# Patient Record
Sex: Male | Born: 1940 | Race: White | Hispanic: No | Marital: Married | Smoking: Former smoker
Health system: Southern US, Community
[De-identification: ages and names within clinical notes are randomized; demographics above are authoritative.]

## PROBLEM LIST (undated history)

## (undated) DIAGNOSIS — L409 Psoriasis, unspecified: Secondary | ICD-10-CM

## (undated) DIAGNOSIS — M1990 Unspecified osteoarthritis, unspecified site: Secondary | ICD-10-CM

## (undated) DIAGNOSIS — N400 Enlarged prostate without lower urinary tract symptoms: Secondary | ICD-10-CM

## (undated) DIAGNOSIS — K219 Gastro-esophageal reflux disease without esophagitis: Secondary | ICD-10-CM

## (undated) DIAGNOSIS — E039 Hypothyroidism, unspecified: Secondary | ICD-10-CM

## (undated) DIAGNOSIS — J189 Pneumonia, unspecified organism: Secondary | ICD-10-CM

## (undated) HISTORY — PX: PROSTATE SURGERY: SHX751

---

## 2001-08-09 ENCOUNTER — Emergency Department (HOSPITAL_COMMUNITY): Admission: EM | Admit: 2001-08-09 | Discharge: 2001-08-09 | Payer: Self-pay | Admitting: Emergency Medicine

## 2001-08-18 ENCOUNTER — Emergency Department (HOSPITAL_COMMUNITY): Admission: EM | Admit: 2001-08-18 | Discharge: 2001-08-18 | Payer: Self-pay | Admitting: Emergency Medicine

## 2002-03-08 ENCOUNTER — Ambulatory Visit (HOSPITAL_COMMUNITY): Admission: RE | Admit: 2002-03-08 | Discharge: 2002-03-08 | Payer: Self-pay | Admitting: Internal Medicine

## 2002-03-17 ENCOUNTER — Encounter: Payer: Self-pay | Admitting: Family Medicine

## 2002-03-17 ENCOUNTER — Ambulatory Visit (HOSPITAL_COMMUNITY): Admission: RE | Admit: 2002-03-17 | Discharge: 2002-03-17 | Payer: Self-pay | Admitting: Family Medicine

## 2003-07-20 ENCOUNTER — Ambulatory Visit (HOSPITAL_COMMUNITY): Admission: RE | Admit: 2003-07-20 | Discharge: 2003-07-20 | Payer: Self-pay | Admitting: Family Medicine

## 2006-11-05 ENCOUNTER — Ambulatory Visit (HOSPITAL_COMMUNITY): Admission: RE | Admit: 2006-11-05 | Discharge: 2006-11-05 | Payer: Self-pay | Admitting: Family Medicine

## 2008-05-23 ENCOUNTER — Encounter (INDEPENDENT_AMBULATORY_CARE_PROVIDER_SITE_OTHER): Payer: Self-pay | Admitting: *Deleted

## 2008-07-26 DIAGNOSIS — K921 Melena: Secondary | ICD-10-CM

## 2008-07-26 DIAGNOSIS — D126 Benign neoplasm of colon, unspecified: Secondary | ICD-10-CM | POA: Insufficient documentation

## 2008-07-26 DIAGNOSIS — K644 Residual hemorrhoidal skin tags: Secondary | ICD-10-CM | POA: Insufficient documentation

## 2010-05-21 NOTE — Procedures (Signed)
Tyler Gordon, Tyler Gordon                ACCOUNT NO.:  [id number]   MEDICAL RECORD NO.:  [id number]          PATIENT TYPE:  OUT   LOCATION:  [facility]                          FACILITY:  [facility]   PHYSICIAN:  Donna Bernard, M.D.DATE OF BIRTH:  09-24-1940   DATE OF PROCEDURE:  11/05/2006  DATE OF DISCHARGE:  11/05/2006                                  STRESS TEST   INDICATION FOR TEST:  This patient is a 70 year old white male with  several risk factors and atypical chest pain.   Stress test was performed with a standard Bruce protocol.  Resting EKG  revealed normal sinus rhythm with no significant ST-T changes.  The  patient developed pretty quick tachycardia with exercise.  His target  heart rate was 130.  This was a sub-max predicted heart rate.  The  patient at the end of stage I noticed a little shortness of breath but  no chest pain.  Two minutes into stage II the patient had reached a  heart rate of 140.  This surpassed the sub-max predicted heart rate.  At  this point at 0.08 seconds out past the J-point there was minimal ST  depression noted.  The ST slope in these areas was sharply.  After the  test was stopped, the rhythm returned to normal sinus rhythm fairly  rapidly with no abnormalities noted in the ST-segment.   IMPRESSION:  Negative adequate stress test.   PLAN:  Regular exercise program encouraged.      Donna Bernard, M.D.  Electronically Signed     WSL/MEDQ  D:  11/16/2006  T:  11/16/2006  Job:  [id number]

## 2010-05-21 NOTE — Procedures (Signed)
NAME:  Tyler Gordon, Tyler Gordon                ACCOUNT NO.:  [id number]   MEDICAL RECORD NO.:  [id number]          PATIENT TYPE:  OUT   LOCATION:  [facility]                          FACILITY:  [facility]   PHYSICIAN:  Donna Bernard, M.D.DATE OF BIRTH:  1940/12/27   DATE OF PROCEDURE:  11/04/2006  DATE OF DISCHARGE:                                  STRESS TEST   PROCEDURE:  Stress test.   INDICATION FOR TEST:  This patient is a 70 year old gentleman with  multiple risk factors who would like to start an exercise program. At  times he gets transient chest pain very atypical in nature but was  concern with his risk factors about pressing on with significant  exercise without a test.  The test was performed at Standard Bruce  protocol, resting EKG revealed normal sinus rhythm with no significant  ST-T changes. The patient tolerated the first stage while. He developed  progressive dyspnea and tachycardia throughout the second stage.  The  patient reaches sub max predicted heart rate of 138 at only 1-1/2  minutes in the second stage. At 5 minutes into the test, the test was  stopped on the basis of having surpassed max predicted heart rate.  The  patient reached a maximum heart rate in the low 140s. At this point at  0.08 seconds past the J-point, there were no clinically significant ST-  segment depressions evident. There was some mild depression a millimeter  or less at 0.08 seconds past J-point but all with a sharply ascending  slope.   IMPRESSION:  Negative adequate stress test.   PLAN:  The patient encouraged to press on with exercise program.      W. Simone Curia, M.D.  Electronically Signed     WSL/MEDQ  D:  11/05/2006  T:  11/06/2006  Job:  [id number]

## 2010-05-24 NOTE — Op Note (Signed)
   NAME:  Tyler Gordon, Tyler Gordon                          ACCOUNT NO.:  [id number]   MEDICAL RECORD NO.:  [id number]                   PATIENT TYPE:  AMB   LOCATION:  DAY                                  FACILITY:  [facility]   PHYSICIAN:  Lionel December, M.D.                 DATE OF BIRTH:  [birth year]   DATE OF PROCEDURE:  03/08/2002  DATE OF DISCHARGE:                                 OPERATIVE REPORT   PROCEDURE:  Total colonoscopy.   ENDOSCOPIST:  Lionel December, M.D.   INDICATIONS:  This patient is a 70 year old Caucasian male who is here for  screening colonoscopy.  Family history is negative for colorectal cancer.  The procedures and risks were reviewed with the patient and informed consent  was obtained.   PREOPERATIVE MEDICATIONS:  Demerol 40 mg IV and Versed 6 mg IV in divided  dose.   INSTRUMENT:  Olympus video system.   FINDINGS:  Procedure performed in endoscopy suite.  The patient's vital  signs and O2 saturation were monitored during the procedure and remained  stable.  The patient was placed in the left lateral recumbent position and  rectal examination was performed.  This was within normal limits.   The scope was placed in the rectum and advanced under vision into the  sigmoid colon and beyond.  Preparation was excellent.  Scope was passed into  the cecum which was identified by appendiceal orifice and ileocecal valve.  A short segment of TI was also examined and was normal.  As the scope was  withdrawn the colonic mucosa was once again carefully examined.  Scattered  diverticula were noted at the descending and sigmoid colon.  These were  small-to-medium in size.  Rectal mucosa was normal.   The scope was retroflexed to examine the anorectal junction which was  unremarkable.  The endoscope was straightened and withdrawn.  The patient  tolerated the procedure well.   FINAL DIAGNOSIS:  Left colonic diverticulosis otherwise normal colonoscopy.   RECOMMENDATIONS:  1. High  fiber diet.  2.     Citrucel or equivalent 1 tablespoonful daily.  3. Yearly Hemoccults when he has his physical exam.  He should consider next     screening exam 10 years from now.                                               Lionel December, M.D.    NR/MEDQ  D:  03/08/2002  T:  03/08/2002  Job:  [id number]   cc:   Donna Bernard, M.D.  [address]  [place]  [place] [address]  Fax: [phone]

## 2010-07-26 ENCOUNTER — Ambulatory Visit (INDEPENDENT_AMBULATORY_CARE_PROVIDER_SITE_OTHER): Payer: Managed Care, Other (non HMO) | Admitting: Urology

## 2010-07-26 DIAGNOSIS — N401 Enlarged prostate with lower urinary tract symptoms: Secondary | ICD-10-CM

## 2010-07-26 DIAGNOSIS — N529 Male erectile dysfunction, unspecified: Secondary | ICD-10-CM

## 2010-07-26 DIAGNOSIS — R972 Elevated prostate specific antigen [PSA]: Secondary | ICD-10-CM

## 2010-07-26 DIAGNOSIS — R339 Retention of urine, unspecified: Secondary | ICD-10-CM

## 2010-08-30 ENCOUNTER — Ambulatory Visit (INDEPENDENT_AMBULATORY_CARE_PROVIDER_SITE_OTHER): Payer: Managed Care, Other (non HMO) | Admitting: Urology

## 2010-08-30 DIAGNOSIS — R339 Retention of urine, unspecified: Secondary | ICD-10-CM

## 2010-08-30 DIAGNOSIS — N529 Male erectile dysfunction, unspecified: Secondary | ICD-10-CM

## 2010-08-30 DIAGNOSIS — N401 Enlarged prostate with lower urinary tract symptoms: Secondary | ICD-10-CM

## 2010-11-15 ENCOUNTER — Ambulatory Visit (INDEPENDENT_AMBULATORY_CARE_PROVIDER_SITE_OTHER): Payer: Medicare Other | Admitting: Urology

## 2010-11-15 ENCOUNTER — Other Ambulatory Visit: Payer: Self-pay | Admitting: Urology

## 2010-11-15 DIAGNOSIS — N401 Enlarged prostate with lower urinary tract symptoms: Secondary | ICD-10-CM

## 2010-11-15 DIAGNOSIS — R338 Other retention of urine: Secondary | ICD-10-CM

## 2010-11-19 ENCOUNTER — Encounter (HOSPITAL_COMMUNITY): Payer: Self-pay

## 2010-11-22 ENCOUNTER — Encounter (HOSPITAL_COMMUNITY)
Admission: RE | Admit: 2010-11-22 | Discharge: 2010-11-22 | Payer: Medicare Other | Source: Ambulatory Visit | Attending: Urology | Admitting: Urology

## 2010-11-22 ENCOUNTER — Encounter (HOSPITAL_COMMUNITY): Payer: Self-pay

## 2010-11-22 DIAGNOSIS — L409 Psoriasis, unspecified: Secondary | ICD-10-CM

## 2010-11-22 DIAGNOSIS — M1990 Unspecified osteoarthritis, unspecified site: Secondary | ICD-10-CM

## 2010-11-22 DIAGNOSIS — K219 Gastro-esophageal reflux disease without esophagitis: Secondary | ICD-10-CM

## 2010-11-22 DIAGNOSIS — J189 Pneumonia, unspecified organism: Secondary | ICD-10-CM

## 2010-11-22 DIAGNOSIS — N400 Enlarged prostate without lower urinary tract symptoms: Secondary | ICD-10-CM

## 2010-11-22 HISTORY — DX: Pneumonia, unspecified organism: J18.9

## 2010-11-22 HISTORY — DX: Unspecified osteoarthritis, unspecified site: M19.90

## 2010-11-22 HISTORY — DX: Benign prostatic hyperplasia without lower urinary tract symptoms: N40.0

## 2010-11-22 HISTORY — DX: Psoriasis, unspecified: L40.9

## 2010-11-22 HISTORY — DX: Gastro-esophageal reflux disease without esophagitis: K21.9

## 2010-11-22 HISTORY — PX: VASECTOMY: SHX75

## 2010-11-22 LAB — CBC
HCT: 46.3 % (ref 39.0–52.0)
MCH: 30.9 pg (ref 26.0–34.0)
MCHC: 34.3 g/dL (ref 30.0–36.0)
RDW: 12.7 % (ref 11.5–15.5)

## 2010-11-22 LAB — SURGICAL PCR SCREEN
MRSA, PCR: NEGATIVE
Staphylococcus aureus: POSITIVE — AB

## 2010-11-22 NOTE — Patient Instructions (Addendum)
20 [name]  11/22/2010   Your procedure is scheduled on: 11-26-10  Report to [facility] at 11:00 AM.  Call this number if you have problems the morning of surgery: [phone]   Remember:   Do not eat food:After Midnight.  Do not drink clear liquids: After Midnight.may have Clear Liquids until 6:00AM 11-26-10.  Take these medicines the morning of surgery with A SIP OF WATER:Allopurinol   Do not wear jewelry, make-up or nail polish.  Do not wear lotions, powders, or perfumes. You may wear deodorant.  Do not shave 48 hours prior to surgery.  Do not bring valuables to the hospital.  Contacts, dentures or bridgework may not be worn into surgery.  Leave suitcase in the car. After surgery it may be brought to your room.  For patients admitted to the hospital, checkout time is 11:00 AM the day of discharge.   Patients discharged the day of surgery will not be allowed to drive home.  Name and phone number of your driver: mohmed, farver  [phone]  Special Instructions: CHG Shower Use Special Wash: 1/2 bottle night before surgery and 1/2 bottle morning of surgery.   Please read over the following fact sheets that you were given: Blood Transfusion Information and MRSA Information

## 2010-11-25 MED ORDER — CEFOXITIN SODIUM 1 G IV SOLR
1.0000 g | INTRAVENOUS | Status: DC
  Filled 2010-11-25: qty 1

## 2010-11-25 MED ORDER — DEXTROSE 5 % IV SOLN
2.0000 g | Freq: Once | INTRAVENOUS | Status: AC
  Administered 2010-11-26: 2 g via INTRAVENOUS
  Filled 2010-11-25: qty 2

## 2010-11-25 NOTE — H&P (Signed)
Active Problems 1. Acute Urinary Retention 788.20 2. 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 Gross Hematuria 599.71 4. 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 PSA,Elevated 790.93  History of Present Illness  Tyler Gordon returns today for a TURP.  He has BPH with BOO and retention with a PVR of 1900cc.  he was placed on finasteride on 10/29 in addition to his terazosin.  He is not tolerating the foley well and failed a voiding trial.   Past Medical History 1. History of  Arthritis V13.4 2. History of  Depression 311 3. History of  Esophageal Reflux 530.81 4. History of  Gout 274.9 5. History of  Psoriasis 696.1  Surgical History 1. History of  Surgery Of Male Genitalia Vasectomy V25.2  Current Meds 1. Allopurinol 300 MG Oral Tablet; Therapy: (Recorded:20Jul2012) to 2. Ciprofloxacin HCl 500 MG Oral Tablet; Therapy: 26Oct2012 to 3. Finasteride 5 MG Oral Tablet; TAKE 1 TABLET DAILY AS DIRECTED; Therapy: 24Aug2012 to  (Evaluate:24Oct2013)  Requested for: 29Oct2012; Last Rx:29Oct2012 4. Terazosin HCl 10 MG Oral Capsule; Therapy: (Recorded:24Aug2012) to  Allergies 1. No Known Drug Allergies  Family History 1. Family history of  Family Health Status - Mother's Age 61. Family history of  Family Health Status Children ___ Living Daughters 3. Family history of  Family Health Status Children ___ Living Sons 4. Family history of  Family Health Status Number Of Children 5. Family history of  Father Deceased At Age ____ 6. Maternal history of  Hypertension V17.49 7. Family history of  Hypertension V17.49 8. Fraternal history of  Prostate Cancer V16.42  Social History 1. Alcohol Use 2 per day 2. Caffeine Use 3. Former Smoker V15.82 1ppd for 40 years. 4. Marital History - Currently Married 5. 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  Review of Systems  Cardiovascular: no chest pain.  Respiratory: no shortness of breath.      Physical Exam Constitutional: Well nourished and well developed . No acute distress.  Pulmonary: No respiratory distress and normal respiratory rhythm and effort.  Cardiovascular: Heart rate and rhythm are normal . No peripheral edema.    Procedure  His bladder was filled with 250cc and the foley was removed.  He was unable to void and cystoscopy was performed to assess for therapeutic options.   Procedure: Cystoscopy  Indication: Lower Urinary Tract Symptoms.  Informed Consent: Risks, benefits, and potential adverse events were discussed and informed consent was obtained from the patient.  Prep: The patient was prepped with betadine.  Procedure Note:  Urethral meatus:. No abnormalities.  Anterior urethra: No abnormalities.  Prostatic urethra:. Estimated length was 4 cm. There was visual obstruction of the prostatic urethra. The lateral prostatic lobes were enlarged. No intravesical median lobe was visualized.  Bladder: Visulization was clear. The ureteral orifices were in the normal anatomic position bilaterally and had clear efflux of urine. A systematic survey of the bladder demonstrated no bladder tumors or stones. The mucosa was smooth without abnormalities. Examination of the bladder demonstrated moderate trabeculation erythematous mucosa and edema (on the posterior wall from the foley). The patient tolerated the procedure well.  Complications: None.    Assessment 1. 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 Acute Urinary Retention 788.20   He has BPH with retention and failed a voiding trial.   Cystoscopy confirms an obstructing prostate.   Plan Acute Urinary Retention (788.20)  1. Follow-up Schedule Surgery Office  Follow-up  Done: 09Nov2012 2. Cath, simple, wIinsert Temp  Cath  Done: 09Nov2012 Benign Prostatic Hypertrophy With Urinary Obstruction (600.01)  3. Ciprofloxacin HCl 500 MG Oral Tablet; 1 po bid; Therapy: 26Oct2012 to (Evaluate:24Nov2012)    Requested for: 09Nov2012; Last Rx:09Nov2012; Edited 4. Uribel 118 MG Oral Capsule; take 1 po qid prn burning; Therapy: 09Nov2012 to (Last  Rx:09Nov2012) 5. Complex Uroflowmetry   I am going to set him up for a TURP.  The risks of bleeding, infection, persistant retention, incontinence, strictures, fluid overload, thrombotic events and anesthetic complications reviewed.  He was given samples of Uribel for the catheter irritation and was instructed to try neosporin plus for the meatal irritation. He was given Cipro to start 3 days prior to surgery.

## 2010-11-26 ENCOUNTER — Observation Stay (HOSPITAL_COMMUNITY)
Admission: RE | Admit: 2010-11-26 | Discharge: 2010-11-28 | Disposition: A | Discharge status: Home / Self Care | Payer: Medicare Other | Source: Ambulatory Visit | Attending: Urology | Admitting: Urology

## 2010-11-26 ENCOUNTER — Encounter (HOSPITAL_COMMUNITY): Payer: Self-pay | Admitting: Certified Registered Nurse Anesthetist

## 2010-11-26 ENCOUNTER — Encounter (HOSPITAL_COMMUNITY): Payer: Self-pay | Admitting: *Deleted

## 2010-11-26 ENCOUNTER — Ambulatory Visit (HOSPITAL_COMMUNITY): Payer: Medicare Other | Admitting: Certified Registered Nurse Anesthetist

## 2010-11-26 ENCOUNTER — Encounter (HOSPITAL_COMMUNITY)
Admission: RE | Disposition: A | Discharge status: Home / Self Care | Payer: Self-pay | Source: Ambulatory Visit | Attending: Urology

## 2010-11-26 ENCOUNTER — Other Ambulatory Visit: Payer: Self-pay | Admitting: Urology

## 2010-11-26 DIAGNOSIS — Z01812 Encounter for preprocedural laboratory examination: Secondary | ICD-10-CM | POA: Insufficient documentation

## 2010-11-26 DIAGNOSIS — N320 Bladder-neck obstruction: Secondary | ICD-10-CM | POA: Insufficient documentation

## 2010-11-26 DIAGNOSIS — R42 Dizziness and giddiness: Secondary | ICD-10-CM | POA: Insufficient documentation

## 2010-11-26 DIAGNOSIS — R972 Elevated prostate specific antigen [PSA]: Secondary | ICD-10-CM | POA: Insufficient documentation

## 2010-11-26 DIAGNOSIS — N138 Other obstructive and reflux uropathy: Principal | ICD-10-CM | POA: Insufficient documentation

## 2010-11-26 DIAGNOSIS — Z79899 Other long term (current) drug therapy: Secondary | ICD-10-CM | POA: Insufficient documentation

## 2010-11-26 DIAGNOSIS — Z8639 Personal history of other endocrine, nutritional and metabolic disease: Secondary | ICD-10-CM | POA: Insufficient documentation

## 2010-11-26 DIAGNOSIS — R310 Gross hematuria: Secondary | ICD-10-CM | POA: Insufficient documentation

## 2010-11-26 DIAGNOSIS — N401 Enlarged prostate with lower urinary tract symptoms: Secondary | ICD-10-CM | POA: Diagnosis present

## 2010-11-26 DIAGNOSIS — N529 Male erectile dysfunction, unspecified: Secondary | ICD-10-CM | POA: Insufficient documentation

## 2010-11-26 DIAGNOSIS — Z872 Personal history of diseases of the skin and subcutaneous tissue: Secondary | ICD-10-CM | POA: Insufficient documentation

## 2010-11-26 DIAGNOSIS — K219 Gastro-esophageal reflux disease without esophagitis: Secondary | ICD-10-CM | POA: Insufficient documentation

## 2010-11-26 DIAGNOSIS — R339 Retention of urine, unspecified: Secondary | ICD-10-CM | POA: Insufficient documentation

## 2010-11-26 DIAGNOSIS — Z862 Personal history of diseases of the blood and blood-forming organs and certain disorders involving the immune mechanism: Secondary | ICD-10-CM | POA: Insufficient documentation

## 2010-11-26 DIAGNOSIS — N400 Enlarged prostate without lower urinary tract symptoms: Secondary | ICD-10-CM

## 2010-11-26 DIAGNOSIS — Z8719 Personal history of other diseases of the digestive system: Secondary | ICD-10-CM | POA: Insufficient documentation

## 2010-11-26 SURGERY — TRANSURETHRAL PROSTATECTOMY WITH GYRUS INSTRUMENTS
Anesthesia: General | Wound class: Clean Contaminated

## 2010-11-26 MED ORDER — ROCURONIUM BROMIDE 100 MG/10ML IV SOLN
INTRAVENOUS | Status: DC | PRN
  Administered 2010-11-26: 5 mg via INTRAVENOUS

## 2010-11-26 MED ORDER — DEXTROSE 5 % IV SOLN: 2.0000 g | Freq: Two times a day (BID) | INTRAVENOUS | Status: DC

## 2010-11-26 MED ORDER — METOCLOPRAMIDE HCL 5 MG/ML IJ SOLN
INTRAMUSCULAR | Status: DC | PRN
  Administered 2010-11-26: 10 mg via INTRAVENOUS
  Administered 2010-11-26: 5 mg via INTRAVENOUS

## 2010-11-26 MED ORDER — PROPOFOL 10 MG/ML IV EMUL
INTRAVENOUS | Status: DC | PRN
  Administered 2010-11-26: 150 mg via INTRAVENOUS
  Administered 2010-11-26: 50 mg via INTRAVENOUS

## 2010-11-26 MED ORDER — ACETAMINOPHEN 325 MG PO TABS: 650.0000 mg | ORAL_TABLET | ORAL | Status: DC | PRN

## 2010-11-26 MED ORDER — MEPERIDINE HCL 50 MG/ML IJ SOLN: 6.2500 mg | INTRAMUSCULAR | Status: DC | PRN

## 2010-11-26 MED ORDER — MORPHINE SULFATE 2 MG/ML IJ SOLN
2.0000 mg | INTRAMUSCULAR | Status: DC | PRN
  Administered 2010-11-26: 2 mg via INTRAVENOUS
  Filled 2010-11-26: qty 1

## 2010-11-26 MED ORDER — SODIUM CHLORIDE 0.9 % IR SOLN
3000.0000 mL | Status: DC
  Administered 2010-11-26: 3000 mL

## 2010-11-26 MED ORDER — ACETAMINOPHEN 10 MG/ML IV SOLN
INTRAVENOUS | Status: DC | PRN
  Administered 2010-11-26: 1000 mg via INTRAVENOUS

## 2010-11-26 MED ORDER — LACTATED RINGERS IV SOLN
INTRAVENOUS | Status: DC
  Administered 2010-11-26 (×2): via INTRAVENOUS

## 2010-11-26 MED ORDER — ONDANSETRON HCL 4 MG/2ML IJ SOLN: 4.0000 mg | INTRAMUSCULAR | Status: DC | PRN

## 2010-11-26 MED ORDER — KCL IN DEXTROSE-NACL 20-5-0.45 MEQ/L-%-% IV SOLN
INTRAVENOUS | Status: DC
  Administered 2010-11-26: 1000 mL via INTRAVENOUS
  Filled 2010-11-26 (×6): qty 1000

## 2010-11-26 MED ORDER — FENTANYL CITRATE 0.05 MG/ML IJ SOLN
INTRAMUSCULAR | Status: DC | PRN
  Administered 2010-11-26: 50 ug via INTRAVENOUS
  Administered 2010-11-26: 100 ug via INTRAVENOUS

## 2010-11-26 MED ORDER — HYDROCODONE-ACETAMINOPHEN 5-325 MG PO TABS
1.0000 | ORAL_TABLET | ORAL | Status: DC | PRN
  Administered 2010-11-26 – 2010-11-27 (×2): 2 via ORAL
  Filled 2010-11-26 (×2): qty 2

## 2010-11-26 MED ORDER — HYOSCYAMINE SULFATE 0.125 MG SL SUBL
0.1250 mg | SUBLINGUAL_TABLET | SUBLINGUAL | Status: DC | PRN
  Filled 2010-11-26: qty 1

## 2010-11-26 MED ORDER — ALLOPURINOL 300 MG PO TABS
300.0000 mg | ORAL_TABLET | Freq: Every day | ORAL | Status: DC
  Administered 2010-11-26 – 2010-11-27 (×2): 300 mg via ORAL
  Filled 2010-11-26 (×3): qty 1

## 2010-11-26 MED ORDER — DOCUSATE SODIUM 100 MG PO CAPS
100.0000 mg | ORAL_CAPSULE | Freq: Two times a day (BID) | ORAL | Status: DC
  Administered 2010-11-26 – 2010-11-27 (×3): 100 mg via ORAL
  Filled 2010-11-26 (×5): qty 1

## 2010-11-26 MED ORDER — LACTATED RINGERS IV SOLN: INTRAVENOUS | Status: DC

## 2010-11-26 MED ORDER — BIOTENE DRY MOUTH MT LIQD
15.0000 mL | Freq: Two times a day (BID) | OROMUCOSAL | Status: DC
  Administered 2010-11-26 – 2010-11-28 (×4): 15 mL via OROMUCOSAL

## 2010-11-26 MED ORDER — MIDAZOLAM HCL 5 MG/5ML IJ SOLN
INTRAMUSCULAR | Status: DC | PRN
  Administered 2010-11-26: 2 mg via INTRAVENOUS

## 2010-11-26 MED ORDER — FENTANYL CITRATE 0.05 MG/ML IJ SOLN: 25.0000 ug | INTRAMUSCULAR | Status: DC | PRN

## 2010-11-26 MED ORDER — LABETALOL HCL 5 MG/ML IV SOLN
INTRAVENOUS | Status: DC | PRN
  Administered 2010-11-26: 5 mg via INTRAVENOUS

## 2010-11-26 MED ORDER — ONDANSETRON HCL 4 MG/2ML IJ SOLN
INTRAMUSCULAR | Status: DC | PRN
  Administered 2010-11-26: 4 mg via INTRAVENOUS

## 2010-11-26 MED ORDER — CIPROFLOXACIN HCL 500 MG PO TABS
500.0000 mg | ORAL_TABLET | Freq: Two times a day (BID) | ORAL | Status: DC
  Administered 2010-11-26 – 2010-11-28 (×4): 500 mg via ORAL
  Filled 2010-11-26 (×5): qty 1

## 2010-11-26 MED ORDER — PROMETHAZINE HCL 25 MG/ML IJ SOLN: 6.2500 mg | INTRAMUSCULAR | Status: DC | PRN

## 2010-11-26 MED ORDER — SODIUM CHLORIDE 0.9 % IR SOLN
Status: DC | PRN
  Administered 2010-11-26: 10500 mL

## 2010-11-26 MED ORDER — ZOLPIDEM TARTRATE 5 MG PO TABS: 5.0000 mg | ORAL_TABLET | Freq: Every evening | ORAL | Status: DC | PRN

## 2010-11-26 MED ORDER — LIDOCAINE HCL (CARDIAC) 20 MG/ML IV SOLN
INTRAVENOUS | Status: DC | PRN
  Administered 2010-11-26: 75 mg via INTRAVENOUS

## 2010-11-26 SURGICAL SUPPLY — 26 items
BAG URINE DRAINAGE (UROLOGICAL SUPPLIES) ×1 IMPLANT
BAG URO CATCHER STRL LF (DRAPE) ×2 IMPLANT
BLADE SURG 15 STRL LF DISP TIS (BLADE) IMPLANT
BLADE SURG 15 STRL SS (BLADE)
CATH FOLEY 3WAY 30CC 22FR (CATHETERS) ×1 IMPLANT
CLOTH BEACON ORANGE TIMEOUT ST (SAFETY) ×2 IMPLANT
DRAPE CAMERA CLOSED 9X96 (DRAPES) ×2 IMPLANT
ELECT BUTTON HF 24-28F 2 30DE (ELECTRODE) ×1 IMPLANT
ELECT HF RESECT BIPO 24F 45 ND (CUTTING LOOP) ×1 IMPLANT
ELECT LOOP MED HF 24F 12D (CUTTING LOOP) ×1 IMPLANT
ELECT LOOP MED HF 24F 12D CBL (CLIP) ×1 IMPLANT
ELECT REM PT RETURN 9FT ADLT (ELECTROSURGICAL)
ELECTRODE REM PT RTRN 9FT ADLT (ELECTROSURGICAL) ×1 IMPLANT
GLOVE SURG SS PI 8.0 STRL IVOR (GLOVE) ×2 IMPLANT
GOWN PREVENTION PLUS XLARGE (GOWN DISPOSABLE) ×2 IMPLANT
GOWN STRL REIN XL XLG (GOWN DISPOSABLE) ×2 IMPLANT
HOLDER FOLEY CATH W/STRAP (MISCELLANEOUS) ×1 IMPLANT
IV NS IRRIG 3000ML ARTHROMATIC (IV SOLUTION) ×9 IMPLANT
KIT ASPIRATION TUBING (SET/KITS/TRAYS/PACK) ×2 IMPLANT
MANIFOLD NEPTUNE II (INSTRUMENTS) ×2 IMPLANT
PACK CYSTO (CUSTOM PROCEDURE TRAY) ×2 IMPLANT
SUT ETHILON 3 0 PS 1 (SUTURE) IMPLANT
SYR 30ML LL (SYRINGE) ×1 IMPLANT
SYRINGE IRR TOOMEY STRL 70CC (SYRINGE) ×1 IMPLANT
TUBING CONNECTING 10 (TUBING) ×2 IMPLANT
WATER STERILE IRR 500ML POUR (IV SOLUTION) ×1 IMPLANT

## 2010-11-26 NOTE — Transfer of Care (Signed)
Immediate Anesthesia Transfer of Care Note  Patient: Tyler Gordon  Procedure(s) Performed:  TRANSURETHRAL PROSTATECTOMY WITH GYRUS INSTRUMENTS  Patient Location: PACU  Anesthesia Type: General  Level of Consciousness: awake, alert  and responds to stimulation  Airway & Oxygen Therapy: Patient Spontanous Breathing and Patient connected to face mask oxygen  Post-op Assessment: Report given to PACU RN and Post -op Vital signs reviewed and stable  Post vital signs: stable  Complications: No apparent anesthesia complications

## 2010-11-26 NOTE — Anesthesia Preprocedure Evaluation (Signed)
Anesthesia Evaluation  Patient identified by MRN, date of birth, ID band Patient awake    Reviewed: Allergy & Precautions, H&P , NPO status , Patient's Chart, lab work & pertinent test results  History of Anesthesia Complications Negative for: history of anesthetic complications  Airway Mallampati: II TM Distance: >3 FB Neck ROM: Full    Dental No notable dental hx. (+) Upper Dentures   Pulmonary neg pulmonary ROS,  clear to auscultation  Pulmonary exam normal       Cardiovascular neg cardio ROS Regular Normal    Neuro/Psych Negative Neurological ROS  Negative Psych ROS   GI/Hepatic negative GI ROS, Neg liver ROS, GERD-  Poorly Controlled,  Endo/Other  Negative Endocrine ROS  Renal/GU negative Renal ROS  Genitourinary negative   Musculoskeletal negative musculoskeletal ROS (+)   Abdominal   Peds negative pediatric ROS (+)  Hematology negative hematology ROS (+)   Anesthesia Other Findings   Reproductive/Obstetrics negative OB ROS                           Anesthesia Physical Anesthesia Plan  ASA: II  Anesthesia Plan: General   Post-op Pain Management:    Induction: Intravenous  Airway Management Planned: Oral ETT  Additional Equipment:   Intra-op Plan:   Post-operative Plan: Extubation in OR  Informed Consent: I have reviewed the patients History and Physical, chart, labs and discussed the procedure including the risks, benefits and alternatives for the proposed anesthesia with the patient or authorized representative who has indicated his/her understanding and acceptance.   Dental advisory given  Plan Discussed with: CRNA  Anesthesia Plan Comments:         Anesthesia Quick Evaluation

## 2010-11-26 NOTE — Brief Op Note (Signed)
11/26/2010  2:41 PM  PATIENT:  Tyler Gordon  70 y.o. male  PRE-OPERATIVE DIAGNOSIS:  Benign Prostatic Hyperplasia with obstruction.  POST-OPERATIVE DIAGNOSIS:  Benign Prostatic Hyperplasia with obstruction.  PROCEDURE:  Procedure(s): TRANSURETHRAL PROSTATECTOMY WITH GYRUS INSTRUMENTS  SURGEON:  Surgeon(s): Anner Crete  PHYSICIAN ASSISTANT:   ASSISTANTS: none   ANESTHESIA:   general  EBL:   Total I/O In: 1000 [I.V.:1000] Out: 225 [Urine:225]  BLOOD ADMINISTERED:none  DRAINS: Urinary Catheter (Foley)   LOCAL MEDICATIONS USED:  NONE  SPECIMEN:  Source of Specimen:  prostate chips  DISPOSITION OF SPECIMEN:  PATHOLOGY  COUNTS:  YES  TOURNIQUET:  * No tourniquets in log *  DICTATION: .Other Dictation: Dictation Number [id number]  PLAN OF CARE: Admit for overnight observation  PATIENT DISPOSITION:  PACU - hemodynamically stable.   Delay start of Pharmacological VTE agent (>24hrs) due to surgical blood loss or risk of bleeding:  {YES/NO/NOT APPLICABLE:20182

## 2010-11-26 NOTE — Interval H&P Note (Signed)
History and Physical Interval Note:   11/26/2010   1:14 PM   Tyler Gordon  has presented today for surgery, with the diagnosis of Benign Prostatic Hyperplasia  The various methods of treatment have been discussed with the patient and family. After consideration of risks, benefits and other options for treatment, the patient has consented to  Procedure(s): TRANSURETHRAL PROSTATECTOMY WITH GYRUS INSTRUMENTS as a surgical intervention .  The patients' history has been reviewed, patient examined, no change in status, stable for surgery.  I have reviewed the patients' chart and labs.  Questions were answered to the patient's satisfaction.     Anner Crete  MD

## 2010-11-26 NOTE — Anesthesia Postprocedure Evaluation (Signed)
  Anesthesia Post-op Note  Patient: Tyler Gordon  Procedure(s) Performed:  TRANSURETHRAL PROSTATECTOMY WITH GYRUS INSTRUMENTS  Patient Location: PACU  Anesthesia Type: General  Level of Consciousness: awake and alert   Airway and Oxygen Therapy: Patient Spontanous Breathing  Post-op Pain: mild  Post-op Assessment: Post-op Vital signs reviewed, Patient's Cardiovascular Status Stable, Respiratory Function Stable, Patent Airway and No signs of Nausea or vomiting  Post-op Vital Signs: stable  Complications: No apparent anesthesia complications

## 2010-11-26 NOTE — Op Note (Signed)
Tyler Gordon, Tyler Gordon                ACCOUNT NO.:  [id number]  MEDICAL RECORD NO.:  [id number]  LOCATION:  [facility]                         FACILITY:  [facility]  PHYSICIAN:  Excell Seltzer. Annabell Howells, M.D.    DATE OF BIRTH:  1940/10/14  DATE OF PROCEDURE:  11/26/2010 DATE OF DISCHARGE:                              OPERATIVE REPORT   PROCEDURE:  Transurethral resection of the prostate.  PREOPERATIVE DIAGNOSIS:  Benign prostatic hypertrophy with bladder outlet obstruction.  POSTOPERATIVE DIAGNOSIS:  Benign prostatic hypertrophy with bladder outlet obstruction.  SURGEON:  Excell Seltzer. Annabell Howells, M.D.  ANESTHESIA:  General.  SPECIMEN:  Prostate chips.  DRAIN:  22-French 3-way Foley catheter.  BLOOD LOSS:  200 cc.  COMPLICATIONS:  None.  INDICATIONS:  Mr. Lehigh is a 70 year old white male with BPH with bladder outlet obstruction and a history of retention.  He had been on finasteride and terazosin, but was still unable to void and has elected TURP.  FINDINGS AND PROCEDURE:  He was taken to the operating room.  After receiving 2 g of cefoxitin, a general anesthetic was induced.  He was fitted with PAS hose, placed in lithotomy position.  His perineum and genitalia were prepped with Betadine solution and he was draped in usual sterile fashion.  Cystoscopy was performed using 22-French scope and 12- degree lens.  Examination revealed a normal urethra.  The external sphincter was intact.  The prostatic urethra had bilobar hyperplasia and was approximately 4 cm in length.  The bladder had mild trabeculation. No tumors, stones, or inflammation were noted.  Ureteral orifices were unremarkable.  After cystoscopy, a 28-French continuous flow resectoscope sheath was inserted.  This was fitted with an [place] handle with 12 degree lens and gyrus loop.  Normal saline was used for the irrigant.  The resection was initiated at the bladder neck exposing the fibers from 5 to 7 o'clock.  The floor of the prostate  was then resected out to alongside the verumontanum.  The right lobe of the prostate was resected from bladder neck to apex out to capsular fibers.  This was followed by the left lobe.  At this point, the chips were evacuated and inspection revealed a residual apical and anterior tissue, which was then resected.  The chips were then removed again and hemostasis was achieved.  Once the bladder had been confirmed free of chips and the prostatic urethra free of active bleeding, further inspection revealed intact ureteral orifices and intact external sphincter.  At this point, the scope was removed. Pressure on the bladder produced a good stream.  A 22-French 3-way Foley catheter was placed with the aid of a catheter guide.  The balloon was filled with 30 cc of sterile fluid.  The catheter was hand-irrigated with clear return and placed on continuous irrigation and straight drainage.  The patient was taken down from lithotomy position.  His anesthetic was reversed.  He was moved to recovery room in stable condition.  There were no complications.     Excell Seltzer. Annabell Howells, M.D.     JJW/MEDQ  D:  11/26/2010  T:  11/26/2010  Job:  [id number]  cc:   Gretel Acre, MD Fax: [phone]

## 2010-11-27 MED ORDER — MAGNESIUM HYDROXIDE 400 MG/5ML PO SUSP: 30.0000 mL | Freq: Every day | ORAL | Status: DC | PRN

## 2010-11-27 MED ORDER — DOCUSATE SODIUM 100 MG PO CAPS: 100.0000 mg | ORAL_CAPSULE | Freq: Two times a day (BID) | ORAL | Status: DC

## 2010-11-27 MED ORDER — CIPROFLOXACIN HCL 250 MG PO TABS: 500.0000 mg | ORAL_TABLET | Freq: Two times a day (BID) | ORAL | Status: DC

## 2010-11-27 MED ORDER — HYDROCODONE-ACETAMINOPHEN 5-325 MG PO TABS: 1.0000 | ORAL_TABLET | Freq: Four times a day (QID) | ORAL | Status: DC | PRN

## 2010-11-27 NOTE — Progress Notes (Signed)
1 Day Post-Op  Subjective: Mr. Tyler Gordon is doing well but has some persistant bleeding around the foley.  The urine is light pink on CBI.   His BP is a bit low and he has had some light headedness.  He has a normal pulse.  He has some catheter irritation but no other pain.  Objective: Vital signs in last 24 hours: Temp:  [97.4 F (36.3 C)-98.7 F (37.1 C)] 97.4 F (36.3 C) (11/21 0458) Pulse Rate:  [70-79] 73  (11/21 0458) Resp:  [12-18] 18  (11/21 0458) BP: (97-133)/(58-88) 97/58 mmHg (11/21 0458) SpO2:  [92 %-99 %] 92 % (11/21 0458) Weight:  [94.348 kg (208 lb)] 208 lb (94.348 kg) (11/20 1546) Last BM Date: 11/26/10  Intake/Output from previous day: 11/20 0701 - 11/21 0700 In: 2220 [I.V.:1870] Out: -1075 [Blood:50] Intake/Output this shift: Total I/O In: 800 [I.V.:800] Out: -   General appearance: alert and no distress Resp: clear to auscultation bilaterally Cardio: regular rate and rhythm, S1, S2 normal, no murmur, click, rub or gallop  Lab Results:  No results found for this basename: WBC:2,HGB:2,HCT:2,PLT:2 in the last 72 hours BMET No results found for this basename: NA:2,K:2,CL:2,CO2:2,GLUCOSE:2,BUN:2,CREATININE:2,CALCIUM:2 in the last 72 hours PT/INR No results found for this basename: LABPROT:2,INR:2 in the last 72 hours ABG No results found for this basename: PHART:2,PCO2:2,PO2:2,HCO3:2 in the last 72 hours  Studies/Results: No results found.  Anti-infectives: Anti-infectives     Start     Dose/Rate Route Frequency Ordered Stop   11/27/10 0000   ciprofloxacin (CIPRO) 250 MG tablet        500 mg Oral 2 times daily 11/27/10 1610 12/07/10 2359   11/26/10 2200   cefOXitin (MEFOXIN) 2 g in dextrose 5 % 50 mL IVPB  Status:  Discontinued        2 g 100 mL/hr over 30 Minutes Intravenous Every 12 hours 11/26/10 1708 11/26/10 1709   11/26/10 2000   ciprofloxacin (CIPRO) tablet 500 mg        500 mg Oral 2 times daily 11/26/10 1709     11/26/10 0800   cefOXitin  (MEFOXIN) 2 g in dextrose 5 % 50 mL IVPB        2 g 100 mL/hr over 30 Minutes Intravenous  Once 11/25/10 2224 11/26/10 1330   11/26/10 0000   cefOXitin (MEFOXIN) 1 g in dextrose 5 % 50 mL IVPB  Status:  Discontinued        1 g 100 mL/hr over 30 Minutes Intravenous 30 min pre-op 11/25/10 2223 11/25/10 2223          Assessment/Plan: s/p Procedure(s): TRANSURETHRAL PROSTATECTOMY WITH GYRUS INSTRUMENTS Continue foley due to Continue foley due to persistant bleeding around the catheter Plan for discharge tomorrow I will have the foley removed in the AM.  He will be monitored for his BP, but he hasn't had enough bleeding to worry about anemia.  LOS: 1 day    Cade Dashner J 11/27/2010

## 2010-11-29 ENCOUNTER — Encounter (HOSPITAL_COMMUNITY): Payer: Self-pay | Admitting: Emergency Medicine

## 2010-11-29 ENCOUNTER — Emergency Department (HOSPITAL_COMMUNITY)
Admission: EM | Admit: 2010-11-29 | Discharge: 2010-11-29 | Disposition: A | Discharge status: Home / Self Care | Payer: Medicare Other | Attending: Emergency Medicine | Admitting: Emergency Medicine

## 2010-11-29 DIAGNOSIS — Z9889 Other specified postprocedural states: Secondary | ICD-10-CM | POA: Insufficient documentation

## 2010-11-29 DIAGNOSIS — R338 Other retention of urine: Secondary | ICD-10-CM

## 2010-11-29 DIAGNOSIS — R319 Hematuria, unspecified: Secondary | ICD-10-CM | POA: Insufficient documentation

## 2010-11-29 DIAGNOSIS — R10819 Abdominal tenderness, unspecified site: Secondary | ICD-10-CM | POA: Insufficient documentation

## 2010-11-29 DIAGNOSIS — R300 Dysuria: Secondary | ICD-10-CM | POA: Insufficient documentation

## 2010-11-29 LAB — DIFFERENTIAL
Basophils Absolute: 0 10*3/uL (ref 0.0–0.1)
Basophils Relative: 0 % (ref 0–1)
Lymphocytes Relative: 12 % (ref 12–46)
Neutro Abs: 6.7 10*3/uL (ref 1.7–7.7)
Neutrophils Relative %: 78 % — ABNORMAL HIGH (ref 43–77)

## 2010-11-29 LAB — BASIC METABOLIC PANEL
CO2: 31 mEq/L (ref 19–32)
Chloride: 101 mEq/L (ref 96–112)
GFR calc Af Amer: 65 mL/min — ABNORMAL LOW (ref >90)
Potassium: 4.1 mEq/L (ref 3.5–5.1)
Sodium: 140 mEq/L (ref 135–145)

## 2010-11-29 LAB — CBC
HCT: 40.7 % (ref 39.0–52.0)
Platelets: 183 10*3/uL (ref 150–400)
RDW: 12.6 % (ref 11.5–15.5)
WBC: 8.6 10*3/uL (ref 4.0–10.5)

## 2010-11-29 LAB — URINE MICROSCOPIC-ADD ON

## 2010-11-29 LAB — URINALYSIS, ROUTINE W REFLEX MICROSCOPIC
Glucose, UA: NEGATIVE mg/dL
Leukocytes, UA: NEGATIVE
Nitrite: NEGATIVE
pH: 8 (ref 5.0–8.0)

## 2010-11-29 MED ORDER — HYDROCODONE-ACETAMINOPHEN 5-325 MG PO TABS
2.0000 | ORAL_TABLET | Freq: Once | ORAL | Status: AC
  Administered 2010-11-29: 2 via ORAL
  Filled 2010-11-29: qty 1

## 2010-11-29 MED ORDER — HYDROCODONE-ACETAMINOPHEN 5-325 MG PO TABS
ORAL_TABLET | ORAL | Status: AC
  Filled 2010-11-29: qty 1

## 2010-11-29 MED ORDER — LIDOCAINE HCL 2 % EX GEL
Freq: Once | CUTANEOUS | Status: AC
  Administered 2010-11-29: 14:00:00 via URETHRAL
  Filled 2010-11-29: qty 10

## 2010-11-29 NOTE — ED Notes (Signed)
Increased acuity d/t pain level.

## 2010-11-29 NOTE — ED Notes (Signed)
Pt up to bathroom, passed clot in urine

## 2010-11-29 NOTE — ED Notes (Signed)
Pt st's he hasn't been able to go since about 4am this morning, pt had TURP yesterday.  Pt complains of pain and st's he "drips" but isn't able to go.

## 2010-11-29 NOTE — ED Notes (Signed)
Pt had prostate surgery and got out of the hospital yesterday.  Was not having trouble urinating until this morning when it hurt to pee and "it was only dribbling out".

## 2010-11-29 NOTE — ED Provider Notes (Signed)
History     CSN: [id number] Arrival date & time: 11/29/2010 12:14 PM   First MD Initiated Contact with Patient 11/29/10 1255      Chief Complaint  Patient presents with  . Urinary Retention    had prostate surgery tuesday.  "dribbling urine"    (Consider location/radiation/quality/duration/timing/severity/associated sxs/prior treatment) HPI Comments: Patient with a history of BPH who underwent a TURP on Tuesday presents after having been discharged from the hospital yesterday with acute onset of urinary retention - states has had decreased flow and has passed several blood clots prior to this - reports no fever or chills, reports just dribbling prior to this, states mild burning initially, but now pain radiates to back.  Patient is a 70 y.o. male presenting with dysuria. The history is provided by the patient and the spouse. No language interpreter was used.  Dysuria  This is a new problem. The current episode started 6 to 12 hours ago. The problem occurs intermittently. The problem has been gradually worsening. The quality of the pain is described as burning and aching. The pain is at a severity of 10/10. The pain is severe. There has been no fever. He is not sexually active. There is no history of pyelonephritis. Associated symptoms include sweats, frequency, hematuria and urgency. Pertinent negatives include no chills, no nausea, no vomiting, no discharge and no flank pain. He has tried home medications for the symptoms. His past medical history is significant for urological procedure and catheterization.    Past Medical History  Diagnosis Date  . Complication of anesthesia 11-22-10    slow to awaken after anesthesia  . Pneumonia 11-22-10    pneumonia x2, none in 13yrs.  . Benign prostatic hypertrophy 11-22-10    Foley catheter at present, surgery is planned  . GERD (gastroesophageal reflux disease) 11-22-10    reflux-tx. Baking Soda daily  . Arthritis 11-22-10    hx. gout(foot) ,  no problems in 7 yrs  . Psoriasis 11-22-10    occ. outbreaks    Past Surgical History  Procedure Date  . Vasectomy 11-22-10    History reviewed. No pertinent family history.  History  Substance Use Topics  . Smoking status: Former Smoker    Quit date: 11/21/1980  . Smokeless tobacco: Not on file  . Alcohol Use: 1.2 oz/week    2 Glasses of wine per week      Review of Systems  Constitutional: Negative for chills.  Gastrointestinal: Negative for nausea and vomiting.  Genitourinary: Positive for dysuria, urgency, frequency and hematuria. Negative for flank pain.  All other systems reviewed and are negative.    Allergies  Review of patient's allergies indicates no known allergies.  Home Medications   Current Outpatient Rx  Name Route Sig Dispense Refill  . ALLOPURINOL 300 MG PO TABS Oral Take 300 mg by mouth daily.     Marland Kitchen CIPROFLOXACIN HCL 250 MG PO TABS Oral Take 500 mg by mouth 2 (two) times daily.     Marland Kitchen DOCUSATE SODIUM 100 MG PO CAPS Oral Take 100 mg by mouth 2 (two) times daily.      Marland Kitchen HYDROCODONE-ACETAMINOPHEN 5-325 MG PO TABS Oral Take 1 tablet by mouth every 6 (six) hours as needed.       BP 120/66  Pulse 86  Temp(Src) 98 F (36.7 C) (Oral)  Resp 18  Ht 5\' 5"  (1.651 m)  Wt 200 lb (90.719 kg)  BMI 33.28 kg/m2  SpO2 100%  Physical Exam  Nursing note  and vitals reviewed. Constitutional: He is oriented to person, place, and time. He appears well-developed and well-nourished. He appears distressed.  HENT:  Head: Normocephalic and atraumatic.  Right Ear: External ear normal.  Mouth/Throat: Oropharynx is clear and moist.  Eyes: Conjunctivae are normal. Pupils are equal, round, and reactive to light. No scleral icterus.  Neck: Normal range of motion. Neck supple.  Cardiovascular: Normal rate, regular rhythm, normal heart sounds and intact distal pulses.   Pulmonary/Chest: Effort normal and breath sounds normal. No respiratory distress.  Abdominal: Soft.  Bowel sounds are normal. He exhibits no distension. There is tenderness in the suprapubic area. There is no CVA tenderness.  Genitourinary: Rectum normal and penis normal.  Musculoskeletal: Normal range of motion.  Neurological: He is alert and oriented to person, place, and time.  Skin: Skin is warm and dry.  Psychiatric: He has a normal mood and affect. His behavior is normal. Judgment and thought content normal.    ED Course  Procedures (including critical care time)  Labs Reviewed  DIFFERENTIAL - Abnormal; Notable for the following:    Neutrophils Relative 78 (*)    All other components within normal limits  BASIC METABOLIC PANEL - Abnormal; Notable for the following:    Glucose, Bld 112 (*)    GFR calc non Af Amer 56 (*)    GFR calc Af Amer 65 (*)    All other components within normal limits  URINALYSIS, ROUTINE W REFLEX MICROSCOPIC - Abnormal; Notable for the following:    Appearance CLOUDY (*)    Hgb urine dipstick LARGE (*)    Protein, ur 100 (*)    All other components within normal limits  CBC  URINE MICROSCOPIC-ADD ON  URINE CULTURE   No results found.   Urinary Retention   MDM  Patient reports relief from pain after insertion of the indwelling foley catheter by nursing staff.  He had output of 2000 cc blood tinged urine with several small clots.  I have sent this off for UA and culture - I have discussed the patient with Dr. Patsi Sears who will see the patient in the office on Monday - patent reports no further problems.        Izola Price [place], [place] 11/29/10 [phone]

## 2010-11-30 LAB — URINE CULTURE
Colony Count: NO GROWTH
Culture  Setup Time: 201211231759

## 2010-11-30 NOTE — ED Provider Notes (Signed)
Medical screening examination/treatment/procedure(s) were conducted as a shared visit with non-physician practitioner(s) and myself.  I personally evaluated the patient during the encounter    .Face to face Exam:  General:  A&Ox3 HEENT:  Atraumatic Resp:  Normal effort Abd:  Nondistended Neuro:No focal deficits     Nelia Shi, MD 11/30/10 [phone]

## 2010-12-03 ENCOUNTER — Emergency Department (HOSPITAL_COMMUNITY)
Admission: EM | Admit: 2010-12-03 | Discharge: 2010-12-03 | Disposition: A | Discharge status: Home / Self Care | Payer: Medicare Other | Attending: Emergency Medicine | Admitting: Emergency Medicine

## 2010-12-03 ENCOUNTER — Encounter (HOSPITAL_COMMUNITY): Payer: Self-pay | Admitting: *Deleted

## 2010-12-03 DIAGNOSIS — R339 Retention of urine, unspecified: Secondary | ICD-10-CM | POA: Insufficient documentation

## 2010-12-03 DIAGNOSIS — Z9852 Vasectomy status: Secondary | ICD-10-CM | POA: Insufficient documentation

## 2010-12-03 DIAGNOSIS — Z87891 Personal history of nicotine dependence: Secondary | ICD-10-CM | POA: Insufficient documentation

## 2010-12-03 DIAGNOSIS — N400 Enlarged prostate without lower urinary tract symptoms: Secondary | ICD-10-CM | POA: Insufficient documentation

## 2010-12-03 DIAGNOSIS — M129 Arthropathy, unspecified: Secondary | ICD-10-CM | POA: Insufficient documentation

## 2010-12-03 DIAGNOSIS — K219 Gastro-esophageal reflux disease without esophagitis: Secondary | ICD-10-CM | POA: Insufficient documentation

## 2010-12-03 DIAGNOSIS — Z8701 Personal history of pneumonia (recurrent): Secondary | ICD-10-CM | POA: Insufficient documentation

## 2010-12-03 DIAGNOSIS — M109 Gout, unspecified: Secondary | ICD-10-CM | POA: Insufficient documentation

## 2010-12-03 DIAGNOSIS — R338 Other retention of urine: Secondary | ICD-10-CM

## 2010-12-03 LAB — URINALYSIS, ROUTINE W REFLEX MICROSCOPIC
Glucose, UA: NEGATIVE mg/dL
Leukocytes, UA: NEGATIVE
Protein, ur: 30 mg/dL — AB
Specific Gravity, Urine: 1.015 (ref 1.005–1.030)
Urobilinogen, UA: 0.2 mg/dL (ref 0.0–1.0)

## 2010-12-03 MED ORDER — LIDOCAINE HCL (CARDIAC) 20 MG/ML IV SOLN
INTRAVENOUS | Status: AC
  Filled 2010-12-03: qty 5

## 2010-12-03 MED ORDER — CIPROFLOXACIN HCL 500 MG PO TABS: 500.0000 mg | ORAL_TABLET | Freq: Two times a day (BID) | ORAL | Status: AC

## 2010-12-03 MED ORDER — LIDOCAINE HCL 2 % EX GEL
Freq: Once | CUTANEOUS | Status: AC
  Administered 2010-12-03: 07:00:00 via URETHRAL

## 2010-12-03 NOTE — ED Notes (Signed)
Recent prostate surgery, foley removed yesterday at office, unable to void since then

## 2010-12-03 NOTE — ED Notes (Signed)
Unable to urinate since yesterday afternoon,  Attempted foley without success.  Dr Theodoro Kalata notified.  He will insert cuede cath.  Supervisor to get the cath.

## 2010-12-03 NOTE — ED Provider Notes (Signed)
History     CSN: [id number] Arrival date & time: 12/03/2010  6:05 AM   First MD Initiated Contact with Patient 12/03/10 0557      Chief Complaint  Patient presents with  . Urinary Retention    (Consider location/radiation/quality/duration/timing/severity/associated sxs/prior treatment) The history is provided by the patient.   onset tonight. Gradual in onset progressively worsening. Now severe and unable to pass urine. Patient recently had prostate surgery and required Foley for passing clots. His urine cleared and he saw his urologist yesterday in the clinic, Dr. Wilson Singer.  His Foley was removed and he was passing urine without difficulty until last night. No fevers or chills. No nausea vomiting or diarrhea. He has suprapubic discomfort and fullness. He is requesting Foley catheter. No alleviating factors. No radiation of discomfort. Quality is changed from dull discomfort to severe pressure which has been constant since onset.  Past Medical History  Diagnosis Date  . Complication of anesthesia 11-22-10    slow to awaken after anesthesia  . Pneumonia 11-22-10    pneumonia x2, none in 83yrs.  . Benign prostatic hypertrophy 11-22-10    Foley catheter at present, surgery is planned  . GERD (gastroesophageal reflux disease) 11-22-10    reflux-tx. Baking Soda daily  . Arthritis 11-22-10    hx. gout(foot) , no problems in 7 yrs  . Psoriasis 11-22-10    occ. outbreaks  . Gout     Past Surgical History  Procedure Date  . Vasectomy 11-22-10  . Prostate surgery     History reviewed. No pertinent family history.  History  Substance Use Topics  . Smoking status: Former Smoker    Quit date: 11/21/1980  . Smokeless tobacco: Not on file  . Alcohol Use: 1.2 oz/week    2 Glasses of wine per week      Review of Systems  Constitutional: Negative for fever and chills.  HENT: Negative for neck pain and neck stiffness.   Eyes: Negative for pain.  Respiratory: Negative for shortness of  breath.   Cardiovascular: Negative for chest pain, palpitations and leg swelling.  Gastrointestinal: Negative for abdominal pain.  Genitourinary: Positive for decreased urine volume. Negative for dysuria, frequency, hematuria, flank pain and testicular pain.  Musculoskeletal: Negative for back pain.  Skin: Negative for rash.  Neurological: Negative for headaches.  All other systems reviewed and are negative.    Allergies  Review of patient's allergies indicates no known allergies.  Home Medications   Current Outpatient Rx  Name Route Sig Dispense Refill  . DOCUSATE SODIUM 100 MG PO CAPS Oral Take 100 mg by mouth 2 (two) times daily.      Marland Kitchen HYDROCODONE-ACETAMINOPHEN 5-325 MG PO TABS Oral Take 1 tablet by mouth every 6 (six) hours as needed.     . ALLOPURINOL 300 MG PO TABS Oral Take 300 mg by mouth daily.     Marland Kitchen CIPROFLOXACIN HCL 250 MG PO TABS Oral Take 500 mg by mouth 2 (two) times daily.       BP 134/80  Pulse 83  Temp(Src) 98.6 F (37 C) (Oral)  Resp 18  SpO2 93%  Physical Exam  Constitutional: He is oriented to person, place, and time. He appears well-developed and well-nourished.  HENT:  Head: Normocephalic and atraumatic.  Eyes: Conjunctivae and EOM are normal. Pupils are equal, round, and reactive to light.  Neck: Trachea normal. Neck supple. No thyromegaly present.  Cardiovascular: Normal rate, regular rhythm, S1 normal, S2 normal and normal pulses.  No systolic murmur is present   No diastolic murmur is present  Pulses:      Radial pulses are 2+ on the right side, and 2+ on the left side.  Pulmonary/Chest: Effort normal and breath sounds normal. He has no wheezes. He has no rhonchi. He has no rales. He exhibits no tenderness.  Abdominal: Soft. Normal appearance and bowel sounds are normal. He exhibits no mass. There is no rebound, no guarding, no CVA tenderness and negative Murphy's sign.       Suprapubic fullness and tenderness. No abdominal tenderness  otherwise.  Musculoskeletal:       BLE:s Calves nontender, no cords or erythema, negative Homans sign  Neurological: He is alert and oriented to person, place, and time. He has normal strength. No cranial nerve deficit or sensory deficit. GCS eye subscore is 4. GCS verbal subscore is 5. GCS motor subscore is 6.  Skin: Skin is warm and dry. No rash noted. He is not diaphoretic.  Psychiatric: His speech is normal.       Cooperative and appropriate    ED Course  Procedures (including critical care time)  Procedure: cu de catheter placement. Consent obtained. Timeout performed. Patient prepped and draped in a sterile fashion. A 16 French coud placed without difficulty with immediate return of urine. The patient tolerated well.    Labs Reviewed  URINALYSIS, ROUTINE W REFLEX MICROSCOPIC  URINE CULTURE   Nursing staff unable to pass Foley catheter. I placed daily catheter as above with 1600 cc clear to dark urine output no obvious blood and no clots.  Diagnosis: Urinary retention status post surgery   MDM   Urinary retention requiring cu de catheter status post prostate surgery. Patient has close followup with Dr.Wren. Antibiotics prescribed as is the second catheter placement 3 days.  Urine culture pending. Reliable historian, states understanding strict return precautions and followup instructions, is stable for discharge home.        Sunnie Nielsen, MD 12/03/10 [phone]

## 2010-12-04 LAB — URINE CULTURE: Culture: NO GROWTH

## 2010-12-18 NOTE — Discharge Summary (Signed)
Physician Discharge Summary   Patient ID: Tyler Gordon [id number] 70 y.o. 27-Feb-1940  Admit date: 11/26/2010  Discharge date and time: 11/28/2010  9:41 AM   Admitting Physician: Anner Crete   Discharge Physician: Anner Crete  Admission Diagnoses: Benign Prostatic Hyperplasia  Discharge Diagnoses:BPH with BOO  Admission Condition: Good   Discharged Condition:Good  Indication for Admission: BPH with retention.  Hospital Course:[name] was admitted on 11/20 for a TURP for management of his BPH with AUR.  The TUR was uneventful.  His urine was pink on CBI on 11/21 and because of his history of AUR with a large PVR, I elected to keep him  An additional day.   His foley was removed on 11/22 and he was discharged home when voiding.  Consults: none  Significant Diagnostic Studies: none  Treatments:TURP  Discharge Exam: No exam performed today, not indicated.  Disposition: Home or Self Care  Patient Instructions:  Discharge Medication List as of 11/28/2010  9:23 AM    START taking these medications   Details  ciprofloxacin (CIPRO) 250 MG tablet Take 2 tablets (500 mg total) by mouth 2 (two) times daily., Starting 11/27/2010, Until Sat 12/07/10, Print    docusate sodium (COLACE) 100 MG capsule Take 1 capsule (100 mg total) by mouth 2 (two) times daily., Starting 11/27/2010, Until Sat 12/07/10, Print    HYDROcodone-acetaminophen (NORCO) 5-325 MG per tablet Take 1 tablet by mouth every 6 (six) hours as needed for pain., Starting 11/27/2010, Until Sat 12/07/10, Print      CONTINUE these medications which have NOT CHANGED   Details  allopurinol (ZYLOPRIM) 300 MG tablet Take 300 mg by mouth daily. , Until Discontinued, Historical Med      STOP taking these medications     finasteride (PROSCAR) 5 MG tablet        Activity: Per discharge instructions. Diet: regular diet Wound Care: None  Follow-up with Dr. Annabell Howells as instructed in f/u note.  SignedAnner Crete 12/18/2010 1:11 PM

## 2011-04-25 ENCOUNTER — Ambulatory Visit: Payer: Medicare Other | Admitting: Urology

## 2012-07-30 ENCOUNTER — Ambulatory Visit (INDEPENDENT_AMBULATORY_CARE_PROVIDER_SITE_OTHER): Payer: Medicare Other | Admitting: Urology

## 2012-07-30 DIAGNOSIS — N401 Enlarged prostate with lower urinary tract symptoms: Secondary | ICD-10-CM

## 2012-07-30 DIAGNOSIS — R972 Elevated prostate specific antigen [PSA]: Secondary | ICD-10-CM

## 2012-07-30 DIAGNOSIS — N529 Male erectile dysfunction, unspecified: Secondary | ICD-10-CM

## 2015-07-05 DIAGNOSIS — L720 Epidermal cyst: Secondary | ICD-10-CM | POA: Diagnosis not present

## 2015-07-05 DIAGNOSIS — L723 Sebaceous cyst: Secondary | ICD-10-CM | POA: Diagnosis not present

## 2015-07-19 DIAGNOSIS — L570 Actinic keratosis: Secondary | ICD-10-CM | POA: Diagnosis not present

## 2015-07-19 DIAGNOSIS — L723 Sebaceous cyst: Secondary | ICD-10-CM | POA: Diagnosis not present

## 2015-07-19 DIAGNOSIS — L218 Other seborrheic dermatitis: Secondary | ICD-10-CM | POA: Diagnosis not present

## 2015-07-19 DIAGNOSIS — X32XXXA Exposure to sunlight, initial encounter: Secondary | ICD-10-CM | POA: Diagnosis not present

## 2015-08-27 ENCOUNTER — Telehealth: Payer: Self-pay | Admitting: General Practice

## 2015-08-30 NOTE — Telephone Encounter (Deleted)
Error

## 2015-09-28 NOTE — Telephone Encounter (Signed)
Error

## 2015-11-22 DIAGNOSIS — H2511 Age-related nuclear cataract, right eye: Secondary | ICD-10-CM | POA: Diagnosis not present

## 2016-01-31 DIAGNOSIS — K219 Gastro-esophageal reflux disease without esophagitis: Secondary | ICD-10-CM | POA: Diagnosis not present

## 2016-01-31 DIAGNOSIS — Z6834 Body mass index (BMI) 34.0-34.9, adult: Secondary | ICD-10-CM | POA: Diagnosis not present

## 2016-01-31 DIAGNOSIS — Z7689 Persons encountering health services in other specified circumstances: Secondary | ICD-10-CM | POA: Diagnosis not present

## 2016-01-31 DIAGNOSIS — N400 Enlarged prostate without lower urinary tract symptoms: Secondary | ICD-10-CM | POA: Diagnosis not present

## 2016-01-31 DIAGNOSIS — Z8739 Personal history of other diseases of the musculoskeletal system and connective tissue: Secondary | ICD-10-CM | POA: Diagnosis not present

## 2016-02-06 DIAGNOSIS — Z125 Encounter for screening for malignant neoplasm of prostate: Secondary | ICD-10-CM | POA: Diagnosis not present

## 2016-02-06 DIAGNOSIS — Z8739 Personal history of other diseases of the musculoskeletal system and connective tissue: Secondary | ICD-10-CM | POA: Diagnosis not present

## 2016-02-06 DIAGNOSIS — N400 Enlarged prostate without lower urinary tract symptoms: Secondary | ICD-10-CM | POA: Diagnosis not present

## 2016-02-06 DIAGNOSIS — K219 Gastro-esophageal reflux disease without esophagitis: Secondary | ICD-10-CM | POA: Diagnosis not present

## 2016-02-06 DIAGNOSIS — Z7689 Persons encountering health services in other specified circumstances: Secondary | ICD-10-CM | POA: Diagnosis not present

## 2016-05-27 DIAGNOSIS — E781 Pure hyperglyceridemia: Secondary | ICD-10-CM | POA: Diagnosis not present

## 2016-05-27 DIAGNOSIS — M159 Polyosteoarthritis, unspecified: Secondary | ICD-10-CM | POA: Diagnosis not present

## 2016-05-27 DIAGNOSIS — Z9889 Other specified postprocedural states: Secondary | ICD-10-CM | POA: Insufficient documentation

## 2016-05-27 DIAGNOSIS — E039 Hypothyroidism, unspecified: Secondary | ICD-10-CM | POA: Diagnosis not present

## 2016-05-27 DIAGNOSIS — R739 Hyperglycemia, unspecified: Secondary | ICD-10-CM | POA: Diagnosis not present

## 2016-05-27 DIAGNOSIS — Z23 Encounter for immunization: Secondary | ICD-10-CM | POA: Diagnosis not present

## 2016-05-27 DIAGNOSIS — J301 Allergic rhinitis due to pollen: Secondary | ICD-10-CM | POA: Insufficient documentation

## 2016-05-27 DIAGNOSIS — Z8739 Personal history of other diseases of the musculoskeletal system and connective tissue: Secondary | ICD-10-CM | POA: Diagnosis not present

## 2016-05-27 DIAGNOSIS — Z6835 Body mass index (BMI) 35.0-35.9, adult: Secondary | ICD-10-CM | POA: Diagnosis not present

## 2016-12-05 DIAGNOSIS — Z0000 Encounter for general adult medical examination without abnormal findings: Secondary | ICD-10-CM | POA: Diagnosis not present

## 2016-12-05 DIAGNOSIS — Z6835 Body mass index (BMI) 35.0-35.9, adult: Secondary | ICD-10-CM | POA: Diagnosis not present

## 2016-12-05 DIAGNOSIS — Z23 Encounter for immunization: Secondary | ICD-10-CM | POA: Diagnosis not present

## 2016-12-05 DIAGNOSIS — Z9889 Other specified postprocedural states: Secondary | ICD-10-CM | POA: Diagnosis not present

## 2016-12-05 DIAGNOSIS — Z8739 Personal history of other diseases of the musculoskeletal system and connective tissue: Secondary | ICD-10-CM | POA: Diagnosis not present

## 2016-12-05 DIAGNOSIS — M159 Polyosteoarthritis, unspecified: Secondary | ICD-10-CM | POA: Diagnosis not present

## 2016-12-05 DIAGNOSIS — J301 Allergic rhinitis due to pollen: Secondary | ICD-10-CM | POA: Diagnosis not present

## 2016-12-05 DIAGNOSIS — E039 Hypothyroidism, unspecified: Secondary | ICD-10-CM | POA: Diagnosis not present

## 2016-12-05 DIAGNOSIS — E781 Pure hyperglyceridemia: Secondary | ICD-10-CM | POA: Diagnosis not present

## 2016-12-22 DIAGNOSIS — Z6835 Body mass index (BMI) 35.0-35.9, adult: Secondary | ICD-10-CM | POA: Diagnosis not present

## 2016-12-22 DIAGNOSIS — Z23 Encounter for immunization: Secondary | ICD-10-CM | POA: Diagnosis not present

## 2016-12-22 DIAGNOSIS — E781 Pure hyperglyceridemia: Secondary | ICD-10-CM | POA: Diagnosis not present

## 2016-12-22 DIAGNOSIS — Z8739 Personal history of other diseases of the musculoskeletal system and connective tissue: Secondary | ICD-10-CM | POA: Diagnosis not present

## 2016-12-22 DIAGNOSIS — M159 Polyosteoarthritis, unspecified: Secondary | ICD-10-CM | POA: Diagnosis not present

## 2016-12-22 DIAGNOSIS — E039 Hypothyroidism, unspecified: Secondary | ICD-10-CM | POA: Diagnosis not present

## 2016-12-22 DIAGNOSIS — J301 Allergic rhinitis due to pollen: Secondary | ICD-10-CM | POA: Diagnosis not present

## 2016-12-22 DIAGNOSIS — Z9889 Other specified postprocedural states: Secondary | ICD-10-CM | POA: Diagnosis not present

## 2016-12-24 DIAGNOSIS — H0259 Other disorders affecting eyelid function: Secondary | ICD-10-CM | POA: Diagnosis not present

## 2017-02-23 DIAGNOSIS — I10 Essential (primary) hypertension: Secondary | ICD-10-CM | POA: Diagnosis not present

## 2017-02-23 DIAGNOSIS — E039 Hypothyroidism, unspecified: Secondary | ICD-10-CM | POA: Diagnosis not present

## 2017-02-23 DIAGNOSIS — Z9889 Other specified postprocedural states: Secondary | ICD-10-CM | POA: Diagnosis not present

## 2017-02-23 DIAGNOSIS — J301 Allergic rhinitis due to pollen: Secondary | ICD-10-CM | POA: Diagnosis not present

## 2017-02-23 DIAGNOSIS — Z0000 Encounter for general adult medical examination without abnormal findings: Secondary | ICD-10-CM | POA: Diagnosis not present

## 2017-02-23 DIAGNOSIS — R21 Rash and other nonspecific skin eruption: Secondary | ICD-10-CM | POA: Diagnosis not present

## 2017-02-23 DIAGNOSIS — R739 Hyperglycemia, unspecified: Secondary | ICD-10-CM | POA: Diagnosis not present

## 2017-03-10 DIAGNOSIS — I10 Essential (primary) hypertension: Secondary | ICD-10-CM | POA: Diagnosis not present

## 2017-04-03 ENCOUNTER — Emergency Department
Admission: EM | Admit: 2017-04-03 | Discharge: 2017-04-03 | Disposition: A | Discharge status: Home / Self Care | Payer: PPO | Attending: Emergency Medicine | Admitting: Emergency Medicine

## 2017-04-03 DIAGNOSIS — Z79899 Other long term (current) drug therapy: Secondary | ICD-10-CM | POA: Diagnosis not present

## 2017-04-03 DIAGNOSIS — R319 Hematuria, unspecified: Secondary | ICD-10-CM | POA: Diagnosis present

## 2017-04-03 DIAGNOSIS — R310 Gross hematuria: Secondary | ICD-10-CM | POA: Diagnosis not present

## 2017-04-03 DIAGNOSIS — Z87891 Personal history of nicotine dependence: Secondary | ICD-10-CM | POA: Insufficient documentation

## 2017-04-03 LAB — CBC
HEMATOCRIT: 49.6 % (ref 40.0–52.0)
Hemoglobin: 16.8 g/dL (ref 13.0–18.0)
MCH: 30.6 pg (ref 26.0–34.0)
MCHC: 33.8 g/dL (ref 32.0–36.0)
MCV: 90.8 fL (ref 80.0–100.0)
Platelets: 212 10*3/uL (ref 150–440)
RBC: 5.47 MIL/uL (ref 4.40–5.90)
RDW: 13.4 % (ref 11.5–14.5)
WBC: 10.5 10*3/uL (ref 3.8–10.6)

## 2017-04-03 LAB — BASIC METABOLIC PANEL
Anion gap: 11 (ref 5–15)
BUN: 19 mg/dL (ref 6–20)
CALCIUM: 9.2 mg/dL (ref 8.9–10.3)
CHLORIDE: 106 mmol/L (ref 101–111)
CO2: 23 mmol/L (ref 22–32)
Creatinine, Ser: 1.08 mg/dL (ref 0.61–1.24)
GFR calc Af Amer: >60 mL/min (ref >60)
GFR calc non Af Amer: >60 mL/min (ref >60)
GLUCOSE: 125 mg/dL — AB (ref 65–99)
Potassium: 4 mmol/L (ref 3.5–5.1)
Sodium: 140 mmol/L (ref 135–145)

## 2017-04-03 LAB — URINALYSIS, COMPLETE (UACMP) WITH MICROSCOPIC
Bacteria, UA: NONE SEEN
SPECIFIC GRAVITY, URINE: 1.012 (ref 1.005–1.030)
Squamous Epithelial / LPF: NONE SEEN

## 2017-04-03 MED ORDER — CIPROFLOXACIN HCL 500 MG PO TABS
500.0000 mg | ORAL_TABLET | Freq: Once | ORAL | Status: AC
  Administered 2017-04-03: 500 mg via ORAL
  Filled 2017-04-03: qty 1

## 2017-04-03 MED ORDER — CIPROFLOXACIN HCL 500 MG PO TABS: 500.0000 mg | ORAL_TABLET | Freq: Two times a day (BID) | ORAL | 0 refills | Status: AC

## 2017-04-03 NOTE — ED Provider Notes (Signed)
[facility] Emergency Department Provider Note   ____________________________________________    I have reviewed the triage vital signs and the nursing notes.   HISTORY  Chief Complaint Hematuria     HPI Tyler Gordon is a 77 y.o. male who presents with complaints of hematuria.  Patient reports that around 4 PM today he noticed a reddish discoloration of his urine, 2 hours later he noticed it again with possibly some small clots in his urine.  He denies pain to me.  He does report a history of having to have his prostate "shaved" in the past.  He denies fevers or chills.  No dysuria.  No difficulty urinating.  No nausea or vomiting.  Has noticed more frequent and urgent urination over the last 6 months  Past Medical History:  Diagnosis Date  . Arthritis 11-22-10   hx. gout(foot) , no problems in 7 yrs  . Benign prostatic hypertrophy 11-22-10   Foley catheter at present, surgery is planned  . Complication of anesthesia 11-22-10   slow to awaken after anesthesia  . GERD (gastroesophageal reflux disease) 11-22-10   reflux-tx. Baking Soda daily  . Gout   . Pneumonia 11-22-10   pneumonia x2, none in 87yrs.  . Psoriasis 11-22-10   occ. outbreaks    Patient Active Problem List   Diagnosis Date Noted  . BPH (benign prostatic hypertrophy) with urinary obstruction 11/26/2010  . History of gastroesophageal reflux (GERD) 11/26/2010  . COLONIC POLYPS 07/26/2008  . EXTERNAL HEMORRHOIDS 07/26/2008  . BLOOD IN STOOL 07/26/2008    Past Surgical History:  Procedure Laterality Date  . PROSTATE SURGERY    . VASECTOMY  11-22-10    Prior to Admission medications   Medication Sig Start Date End Date Taking? Authorizing Provider  allopurinol (ZYLOPRIM) 300 MG tablet Take 300 mg by mouth daily.     [provider]  ciprofloxacin (CIPRO) 500 MG tablet Take 1 tablet (500 mg total) by mouth 2 (two) times daily for 10 days. 04/03/17 04/13/17  Lavonia Drafts, MD     Allergies Patient has no known allergies.  No family history on file.  Social History Social History   Tobacco Use  . Smoking status: Former Smoker    Last attempt to quit: 11/21/1980    Years since quitting: 36.3  . Smokeless tobacco: Never Used  Substance Use Topics  . Alcohol use: Yes    Alcohol/week: 1.2 oz    Types: 2 Glasses of wine per week  . Drug use: No    Review of Systems  Constitutional: No fever/chills Eyes: No discharge ENT: No sore throat. Cardiovascular: Denies palpitations Respiratory: Denies shortness of breath. Gastrointestinal: No abdominal pain.  No nausea, no vomiting.   Genitourinary: Negative for dysuria.  Hematuria as above  musculoskeletal: Negative for back pain. Skin: Negative for rash. Neurological: Negative for headaches    ____________________________________________   PHYSICAL EXAM:  VITAL SIGNS: ED Triage Vitals  Enc Vitals Group     BP 04/03/17 2053 (!) 155/80     Pulse Rate 04/03/17 2053 100     Resp 04/03/17 2053 18     Temp 04/03/17 2053 98.3 F (36.8 C)     Temp Source 04/03/17 2053 Oral     SpO2 04/03/17 2053 94 %     Weight 04/03/17 2054 90.7 kg (200 lb)     Height --      Head Circumference --      Peak Flow --  Pain Score 04/03/17 2054 0     Pain Loc --      Pain Edu? --      Excl. in [place]? --     Constitutional: Alert and oriented. No acute distress. Pleasant and interactive Eyes: Conjunctivae are normal.   Nose: No congestion/rhinnorhea.  Cardiovascular: Normal rate, regular rhythm. Grossly normal heart sounds.  Good peripheral circulation. Respiratory: Normal respiratory effort.  No retractions. Gastrointestinal: Soft and nontender. No distention.  No CVA tenderness. Genitourinary: Normal exam, no swelling erythema or discharge Musculoskeletal:   Warm and well perfused Neurologic:  Normal speech and language. No gross focal neurologic deficits are appreciated.  Skin:  Skin is warm,  dry and intact. No rash noted. Psychiatric: Mood and affect are normal. Speech and behavior are normal.  ____________________________________________   LABS (all labs ordered are listed, but only abnormal results are displayed)  Labs Reviewed  URINALYSIS, COMPLETE (UACMP) WITH MICROSCOPIC - Abnormal; Notable for the following components:      Result Value   Color, Urine RED (*)    APPearance CLOUDY (*)    Glucose, UA   (*)    Value: TEST NOT REPORTED DUE TO COLOR INTERFERENCE OF URINE PIGMENT   Hgb urine dipstick   (*)    Value: TEST NOT REPORTED DUE TO COLOR INTERFERENCE OF URINE PIGMENT   Bilirubin Urine   (*)    Value: TEST NOT REPORTED DUE TO COLOR INTERFERENCE OF URINE PIGMENT   Ketones, ur   (*)    Value: TEST NOT REPORTED DUE TO COLOR INTERFERENCE OF URINE PIGMENT   Protein, ur   (*)    Value: TEST NOT REPORTED DUE TO COLOR INTERFERENCE OF URINE PIGMENT   Nitrite   (*)    Value: TEST NOT REPORTED DUE TO COLOR INTERFERENCE OF URINE PIGMENT   Leukocytes, UA   (*)    Value: TEST NOT REPORTED DUE TO COLOR INTERFERENCE OF URINE PIGMENT   All other components within normal limits  BASIC METABOLIC PANEL - Abnormal; Notable for the following components:   Glucose, Bld 125 (*)    All other components within normal limits  CBC   ____________________________________________  EKG  None ____________________________________________  RADIOLOGY  None ____________________________________________   PROCEDURES  Procedure(s) performed: No  Procedures   Critical Care performed: No ____________________________________________   INITIAL IMPRESSION / ASSESSMENT AND PLAN / ED COURSE  Pertinent labs & imaging results that were available during my care of the patient were reviewed by me and considered in my medical decision making (see chart for details).  Patient presents with hematuria, likely prostatic in origin although I discussed with him the need for urology  workup/cystoscopy.  No difficulty urinating, lab work unremarkable, will cover with Cipro in case this is infectious.  Close follow-up with his urologist Dr. Roni Bread    ____________________________________________   FINAL CLINICAL IMPRESSION(S) / ED DIAGNOSES  Final diagnoses:  Gross hematuria        Note:  This document was prepared using Dragon voice recognition software and may include unintentional dictation errors.    Lavonia Drafts, MD 04/03/17 2232

## 2017-04-03 NOTE — ED Triage Notes (Signed)
Patient c/o hematuria today, denies hx of the same; denies pain. Patient denies change to force of urinary stream. Patient reports blood clots in urine.

## 2017-04-03 NOTE — ED Notes (Signed)
Pt discharged to home.  Discharge instructions reviewed.  Verbalized understanding.  No questions or concerns at this time.  Teach back verified.  Pt in NAD.  No items left in ED.   

## 2017-04-15 ENCOUNTER — Ambulatory Visit: Payer: Self-pay | Admitting: Urology

## 2017-04-15 DIAGNOSIS — N401 Enlarged prostate with lower urinary tract symptoms: Secondary | ICD-10-CM | POA: Diagnosis not present

## 2017-04-15 DIAGNOSIS — R351 Nocturia: Secondary | ICD-10-CM | POA: Diagnosis not present

## 2017-04-15 DIAGNOSIS — R310 Gross hematuria: Secondary | ICD-10-CM | POA: Diagnosis not present

## 2017-04-23 DIAGNOSIS — Z23 Encounter for immunization: Secondary | ICD-10-CM | POA: Diagnosis not present

## 2017-05-26 DIAGNOSIS — R310 Gross hematuria: Secondary | ICD-10-CM | POA: Diagnosis not present

## 2017-05-26 DIAGNOSIS — Z125 Encounter for screening for malignant neoplasm of prostate: Secondary | ICD-10-CM | POA: Diagnosis not present

## 2017-05-29 DIAGNOSIS — K760 Fatty (change of) liver, not elsewhere classified: Secondary | ICD-10-CM | POA: Diagnosis not present

## 2017-05-29 DIAGNOSIS — R310 Gross hematuria: Secondary | ICD-10-CM | POA: Diagnosis not present

## 2017-06-08 DIAGNOSIS — N401 Enlarged prostate with lower urinary tract symptoms: Secondary | ICD-10-CM | POA: Diagnosis not present

## 2017-06-08 DIAGNOSIS — R310 Gross hematuria: Secondary | ICD-10-CM | POA: Diagnosis not present

## 2017-06-08 DIAGNOSIS — R351 Nocturia: Secondary | ICD-10-CM | POA: Diagnosis not present

## 2017-06-08 DIAGNOSIS — N5201 Erectile dysfunction due to arterial insufficiency: Secondary | ICD-10-CM | POA: Diagnosis not present

## 2017-07-01 DIAGNOSIS — G4700 Insomnia, unspecified: Secondary | ICD-10-CM | POA: Diagnosis not present

## 2017-07-01 DIAGNOSIS — E781 Pure hyperglyceridemia: Secondary | ICD-10-CM | POA: Diagnosis not present

## 2017-07-01 DIAGNOSIS — R739 Hyperglycemia, unspecified: Secondary | ICD-10-CM | POA: Diagnosis not present

## 2017-07-01 DIAGNOSIS — Z9889 Other specified postprocedural states: Secondary | ICD-10-CM | POA: Diagnosis not present

## 2017-07-01 DIAGNOSIS — E039 Hypothyroidism, unspecified: Secondary | ICD-10-CM | POA: Diagnosis not present

## 2017-07-01 DIAGNOSIS — J301 Allergic rhinitis due to pollen: Secondary | ICD-10-CM | POA: Diagnosis not present

## 2017-08-11 ENCOUNTER — Other Ambulatory Visit: Payer: Self-pay

## 2017-08-11 NOTE — Patient Outreach (Signed)
[facility]) Care Management  08/11/2017  Tyler Gordon June 15, 1940 [id number]   Referral Date:08/11/17 Referral Source: [employer]is[employer] Referral Reason: Depression   Outreach Attempt: No answer.  Unable to leave a message.   Plan: RN CM will attempt patient again within 4 days and send a letter.     Jone Baseman, RN, MSN [facility] Care Management Care Management Coordinator Direct Line [phone] Toll Free: [phone]  Fax: [phone]

## 2017-08-12 ENCOUNTER — Other Ambulatory Visit: Payer: Self-pay

## 2017-08-12 NOTE — Patient Outreach (Signed)
[facility]) Care Management  08/12/2017  Tyler Gordon [facility] February 21, 1940 [id number]   Referral Date:08/11/17 Referral Source: Episource Referral Reason: Depression   Outreach Attempt: Called patient twice each time someone picked up the phone and hung up.    Plan: RN CM will attempt patient again within 4 business days.  Jone Baseman, RN, MSN [place] [facility] Care Management Coordinator Direct Line [phone] Cell [phone] Toll Free: [phone]  Fax: [phone]

## 2017-08-13 ENCOUNTER — Other Ambulatory Visit: Payer: Self-pay

## 2017-08-13 NOTE — Patient Outreach (Signed)
[facility]) Care Management  08/13/2017  Chanceler Pullin [facility] 07-27-1940 [id number]   Referral Date:08/11/17 Referral Source:Episource Referral Reason:Depression  Outreach Attempt: spoke with patient.  He is able to verify HIPAA.  Discussed reason for referral.  Patient reports that the nurse practitioner mistaken his grief for depression.  Patient reports that he lost his wife of 53 years about 2 years ago and has sometimes when he is grieving her but denies any depression.  PHQ-2=0.  Patient declined need for [facility] services at this time.   Plan: RN CM will close case.   Jone Baseman, RN, MSN [facility] Care Management Care Management Coordinator Direct Line [phone] Toll Free: [phone]  Fax: [phone]

## 2017-09-02 DIAGNOSIS — Z9889 Other specified postprocedural states: Secondary | ICD-10-CM | POA: Diagnosis not present

## 2017-09-02 DIAGNOSIS — R1032 Left lower quadrant pain: Secondary | ICD-10-CM | POA: Diagnosis not present

## 2017-12-10 DIAGNOSIS — K4090 Unilateral inguinal hernia, without obstruction or gangrene, not specified as recurrent: Secondary | ICD-10-CM | POA: Diagnosis not present

## 2017-12-10 DIAGNOSIS — R1032 Left lower quadrant pain: Secondary | ICD-10-CM | POA: Diagnosis not present

## 2017-12-15 ENCOUNTER — Ambulatory Visit: Payer: Self-pay | Admitting: General Surgery

## 2017-12-15 NOTE — H&P (Signed)
PATIENT PROFILE: Tyler Gordon is a 77 y.o. male who presents to the Clinic for consultation at the request of Dr. Ginette Pitman for evaluation of inguinal pain.  PCP:  Azzie Glatter, MD  HISTORY OF PRESENT ILLNESS: Tyler Gordon reports having pain on the left inguinal area since months ago. The pain is localized to the left pubic area. Pain does not radiates. Pain is aggravated with when walking if his pants does not has a belt. Pain is improved with rest. Sometimes feels a swelling. Denies episodes of abdominal distention, nausea or vomiting. Denies pain on the right groin.    PROBLEM LIST:         Problem List  Date Reviewed: 07/01/2017         Noted   History of prostate surgery 05/27/2016   Acquired hypothyroidism, unspecified 05/27/2016   Seasonal allergic rhinitis due to pollen 05/27/2016   Hypertriglyceridemia 05/27/2016      GENERAL REVIEW OF SYSTEMS:   General ROS: negative for - chills, fatigue, fever, weight gain or weight loss Allergy and Immunology ROS: negative for - hives  Hematological and Lymphatic ROS: negative for - bleeding problems or bruising, negative for palpable nodes Endocrine ROS: negative for - heat or cold intolerance, hair changes Respiratory ROS: negative for - cough, shortness of breath or wheezing Cardiovascular ROS: no chest pain or palpitations GI ROS: negative for nausea, vomiting, abdominal pain, diarrhea, constipation. Positive for heartburn. Musculoskeletal ROS: negative for - joint swelling or muscle pain Neurological ROS: negative for - confusion, syncope Dermatological ROS: negative for pruritus and rash Psychiatric: negative for anxiety, depression, difficulty sleeping and memory loss  MEDICATIONS: CurrentMedications  Current Outpatient Medications  Medication Sig Dispense Refill  . allopurinol (ZYLOPRIM) 300 MG tablet Take 1 tablet (300 mg total) by mouth once daily 30 tablet 5  . esomeprazole (NEXIUM) 20 MG DR  capsule Take 1 capsule (20 mg total) by mouth once daily 30 capsule 5  . levothyroxine (SYNTHROID, LEVOTHROID) 25 MCG tablet Take 1 tablet (25 mcg total) by mouth once daily Take on an empty stomach with a glass of water at least 30-60 minutes before breakfast. 90 tablet 3  . nystatin (MYCOSTATIN) 100,000 unit/gram powder Apply topically 2 (two) times daily 30 g 2   No current facility-administered medications for this visit.       ALLERGIES: Patient has no known allergies.  PAST MEDICAL HISTORY:     Past Medical History:  Diagnosis Date  . GERD (gastroesophageal reflux disease)   . Gout   . Thyroid disease     PAST SURGICAL HISTORY:      Past Surgical History:  Procedure Laterality Date  . prostate shaved  2012     FAMILY HISTORY: History reviewed. No pertinent family history.   SOCIAL HISTORY: Social History          Socioeconomic History  . Marital status: Single    Spouse name: Not on file  . Number of children: Not on file  . Years of education: Not on file  . Highest education level: Not on file  Occupational History  . Not on file  Social Needs  . Financial resource strain: Not on file  . Food insecurity:    Worry: Not on file    Inability: Not on file  . Transportation needs:    Medical: Not on file    Non-medical: Not on file  Tobacco Use  . Smoking status: Never Smoker  . Smokeless tobacco: Never Used  Substance and Sexual Activity  . Alcohol use: Yes    Comment: on occasion  . Drug use: Not on file  . Sexual activity: Defer  Other Topics Concern  . Not on file  Social History Narrative  . Not on file      PHYSICAL EXAM:    Vitals:   12/10/17 0824  BP: 156/89  Pulse: 70   Body mass index is 34.06 kg/m. Weight: 95.7 kg (211 lb)   GENERAL: Alert, active, oriented x3  HEENT: Pupils equal reactive to light. Extraocular movements are intact. Sclera clear. Palpebral conjunctiva normal red  color.  NECK: Supple with no palpable mass and no adenopathy.  LUNGS: Sound clear with no rales rhonchi or wheezes.  HEART: Regular rhythm S1 and S2 without murmur.  ABDOMEN: Soft and depressible, nontender with no palpable mass, no hepatomegaly. Mild tender on left pubic bone area. No bulging or hernia defect appreciated.   EXTREMITIES: Well-developed well-nourished symmetrical with no dependent edema.  NEUROLOGICAL: Awake alert oriented, facial expression symmetrical, moving all extremities.  REVIEW OF DATA: I have reviewed the following data today: No visits with results within 3 Month(s) from this visit.  Latest known visit with results is:  Office Visit on 12/05/2016  Component Date Value  . WBC (White Blood Cell Co* 12/05/2016 7.9   . RBC (Red Blood Cell Coun* 12/05/2016 5.32   . Hemoglobin 12/05/2016 16.8   . Hematocrit 12/05/2016 49.0   . MCV (Mean Corpuscular Vo* 12/05/2016 92.1   . MCH (Mean Corpuscular He* 12/05/2016 31.6*  . MCHC (Mean Corpuscular H* 12/05/2016 34.3   . Platelet Count 12/05/2016 200   . RDW-CV (Red Cell Distrib* 12/05/2016 12.8   . MPV (Mean Platelet Volum* 12/05/2016 10.2   . Neutrophils 12/05/2016 5.47   . Lymphocytes 12/05/2016 1.48   . Monocytes 12/05/2016 0.64   . Eosinophils 12/05/2016 0.11   . Basophils 12/05/2016 0.05   . Neutrophil % 12/05/2016 69.7   . Lymphocyte % 12/05/2016 18.8   . Monocyte % 12/05/2016 8.1   . Eosinophil % 12/05/2016 1.4   . Basophil% 12/05/2016 0.6   . Immature Granulocyte % 12/05/2016 1.4*  . Immature Granulocyte Cou* 12/05/2016 0.11*  . Glucose 12/05/2016 111*  . Sodium 12/05/2016 140   . Potassium 12/05/2016 5.0   . Chloride 12/05/2016 102   . Carbon Dioxide (CO2) 12/05/2016 31.9   . Urea Nitrogen (BUN) 12/05/2016 12   . Creatinine 12/05/2016 1.0   . Glomerular Filtration Ra* 12/05/2016 72   . Calcium 12/05/2016 9.8   . AST  12/05/2016 27   . ALT  12/05/2016 31   . Alk Phos (alkaline Phosp*  12/05/2016 68   . Albumin 12/05/2016 4.4   . Bilirubin, Total 12/05/2016 0.8   . Protein, Total 12/05/2016 6.7   . A/G Ratio 12/05/2016 1.9   . Cholesterol, Total 12/05/2016 193   . Triglyceride 12/05/2016 202*  . HDL (High Density Lipopr* 12/05/2016 34.1   . LDL (Low Density Lipopro* 12/05/2016 119   . VLDL Cholesterol 12/05/2016 40   . Cholesterol/HDL Ratio 12/05/2016 5.7   . Hemoglobin A1C 12/05/2016 5.7*  . Average Blood Glucose (C* 12/05/2016 117   . Thyroid Stimulating Horm* 12/05/2016 4.844   . Color 12/22/2016 Yellow   . Clarity 12/22/2016 Clear   . Specific Gravity 12/22/2016 1.010   . pH, Urine 12/22/2016 6.0   . Protein, Urinalysis 12/22/2016 Negative   . Glucose, Urinalysis 12/22/2016 Negative   . Ketones, Urinalysis 12/22/2016  Negative   . Blood, Urinalysis 12/22/2016 Negative   . Nitrite, Urinalysis 12/22/2016 Negative   . Leukocyte Esterase, Urin* 12/22/2016 Negative   . White Blood Cells, Urina* 12/22/2016 None Seen   . Red Blood Cells, Urinaly* 12/22/2016 None Seen   . Bacteria, Urinalysis 12/22/2016 None Seen   . Squamous Epithelial Cell* 12/22/2016 None Seen      ASSESSMENT: Tyler Gordon is a 77 y.o. male presenting for consultation for left groin pain.    The patient presents with a left groin pain. On physical exam there, a hernia was not able to be appreciated. The physical exam was done on supine and standing position and with valsalva maneuver. There is pain on the left pubic area. This could be to a small fat containing hernia or it may be of inflammation of tendon insertion or weakness of the inguinal floor. Patient refers that he has been doing heavy abdominal exercises over 90 pounds and that might aggravated the pain.   Patient oriented about differential diagnosis of left inguinal hernia, sports hernia and the recommendations of further workup with CT scan. He refers that he had a recent CT scan. There is no CT scan on [facility] or [facility] systems since  2005. Patient oriented about the rationale of the CT scan that if no hernia is appreciated on CT scan, treatment will start with anti inflammatory medications and physical therapy. If it does not improve, surgery may be considered. If Ct scan confirm the hernia, will discuss surgical management.   I personally evaluated the CT scan done on May 2019. There is a small left inguinal hernia that is not mentioned in the report, that can explain patients pain on the left groin. Patient oriented about finding and agreed to proceed with surgery as discussed on initial appointment.   PLAN: 1. Laparoscopic left inguinal hernia repair with mesh (38250) 2. Internal medicine clearance 3. CBC, CMP 4. Do not take aspirin 5 days before surgery.   Patient verbalized understanding, all questions were answered, and were agreeable with the plan outlined above.    Herbert Pun, MD  Electronically signed by Herbert Pun, MD

## 2017-12-15 NOTE — H&P (View-Only) (Signed)
PATIENT PROFILE: Tyler Gordon is a 77 y.o. male who presents to the Clinic for consultation at the request of Dr. Hande for evaluation of inguinal pain.  PCP:  Hande, Vishwanath Handattur, MD  HISTORY OF PRESENT ILLNESS: Mr. Tyler Gordon reports having pain on the left inguinal area since months ago. The pain is localized to the left pubic area. Pain does not radiates. Pain is aggravated with when walking if his pants does not has a belt. Pain is improved with rest. Sometimes feels a swelling. Denies episodes of abdominal distention, nausea or vomiting. Denies pain on the right groin.    PROBLEM LIST:         Problem List  Date Reviewed: 07/01/2017         Noted   History of prostate surgery 05/27/2016   Acquired hypothyroidism, unspecified 05/27/2016   Seasonal allergic rhinitis due to pollen 05/27/2016   Hypertriglyceridemia 05/27/2016      GENERAL REVIEW OF SYSTEMS:   General ROS: negative for - chills, fatigue, fever, weight gain or weight loss Allergy and Immunology ROS: negative for - hives  Hematological and Lymphatic ROS: negative for - bleeding problems or bruising, negative for palpable nodes Endocrine ROS: negative for - heat or cold intolerance, hair changes Respiratory ROS: negative for - cough, shortness of breath or wheezing Cardiovascular ROS: no chest pain or palpitations GI ROS: negative for nausea, vomiting, abdominal pain, diarrhea, constipation. Positive for heartburn. Musculoskeletal ROS: negative for - joint swelling or muscle pain Neurological ROS: negative for - confusion, syncope Dermatological ROS: negative for pruritus and rash Psychiatric: negative for anxiety, depression, difficulty sleeping and memory loss  MEDICATIONS: CurrentMedications  Current Outpatient Medications  Medication Sig Dispense Refill  . allopurinol (ZYLOPRIM) 300 MG tablet Take 1 tablet (300 mg total) by mouth once daily 30 tablet 5  . esomeprazole (NEXIUM) 20 MG DR  capsule Take 1 capsule (20 mg total) by mouth once daily 30 capsule 5  . levothyroxine (SYNTHROID, LEVOTHROID) 25 MCG tablet Take 1 tablet (25 mcg total) by mouth once daily Take on an empty stomach with a glass of water at least 30-60 minutes before breakfast. 90 tablet 3  . nystatin (MYCOSTATIN) 100,000 unit/gram powder Apply topically 2 (two) times daily 30 g 2   No current facility-administered medications for this visit.       ALLERGIES: Patient has no known allergies.  PAST MEDICAL HISTORY:     Past Medical History:  Diagnosis Date  . GERD (gastroesophageal reflux disease)   . Gout   . Thyroid disease     PAST SURGICAL HISTORY:      Past Surgical History:  Procedure Laterality Date  . prostate shaved  2012     FAMILY HISTORY: History reviewed. No pertinent family history.   SOCIAL HISTORY: Social History          Socioeconomic History  . Marital status: Single    Spouse name: Not on file  . Number of children: Not on file  . Years of education: Not on file  . Highest education level: Not on file  Occupational History  . Not on file  Social Needs  . Financial resource strain: Not on file  . Food insecurity:    Worry: Not on file    Inability: Not on file  . Transportation needs:    Medical: Not on file    Non-medical: Not on file  Tobacco Use  . Smoking status: Never Smoker  . Smokeless tobacco: Never Used    Substance and Sexual Activity  . Alcohol use: Yes    Comment: on occasion  . Drug use: Not on file  . Sexual activity: Defer  Other Topics Concern  . Not on file  Social History Narrative  . Not on file      PHYSICAL EXAM:    Vitals:   12/10/17 0824  BP: 156/89  Pulse: 70   Body mass index is 34.06 kg/m. Weight: 95.7 kg (211 lb)   GENERAL: Alert, active, oriented x3  HEENT: Pupils equal reactive to light. Extraocular movements are intact. Sclera clear. Palpebral conjunctiva normal red  color.  NECK: Supple with no palpable mass and no adenopathy.  LUNGS: Sound clear with no rales rhonchi or wheezes.  HEART: Regular rhythm S1 and S2 without murmur.  ABDOMEN: Soft and depressible, nontender with no palpable mass, no hepatomegaly. Mild tender on left pubic bone area. No bulging or hernia defect appreciated.   EXTREMITIES: Well-developed well-nourished symmetrical with no dependent edema.  NEUROLOGICAL: Awake alert oriented, facial expression symmetrical, moving all extremities.  REVIEW OF DATA: I have reviewed the following data today: No visits with results within 3 Month(s) from this visit.  Latest known visit with results is:  Office Visit on 12/05/2016  Component Date Value  . WBC (White Blood Cell Co* 12/05/2016 7.9   . RBC (Red Blood Cell Coun* 12/05/2016 5.32   . Hemoglobin 12/05/2016 16.8   . Hematocrit 12/05/2016 49.0   . MCV (Mean Corpuscular Vo* 12/05/2016 92.1   . MCH (Mean Corpuscular He* 12/05/2016 31.6*  . MCHC (Mean Corpuscular H* 12/05/2016 34.3   . Platelet Count 12/05/2016 200   . RDW-CV (Red Cell Distrib* 12/05/2016 12.8   . MPV (Mean Platelet Volum* 12/05/2016 10.2   . Neutrophils 12/05/2016 5.47   . Lymphocytes 12/05/2016 1.48   . Monocytes 12/05/2016 0.64   . Eosinophils 12/05/2016 0.11   . Basophils 12/05/2016 0.05   . Neutrophil % 12/05/2016 69.7   . Lymphocyte % 12/05/2016 18.8   . Monocyte % 12/05/2016 8.1   . Eosinophil % 12/05/2016 1.4   . Basophil% 12/05/2016 0.6   . Immature Granulocyte % 12/05/2016 1.4*  . Immature Granulocyte Cou* 12/05/2016 0.11*  . Glucose 12/05/2016 111*  . Sodium 12/05/2016 140   . Potassium 12/05/2016 5.0   . Chloride 12/05/2016 102   . Carbon Dioxide (CO2) 12/05/2016 31.9   . Urea Nitrogen (BUN) 12/05/2016 12   . Creatinine 12/05/2016 1.0   . Glomerular Filtration Ra* 12/05/2016 72   . Calcium 12/05/2016 9.8   . AST  12/05/2016 27   . ALT  12/05/2016 31   . Alk Phos (alkaline Phosp*  12/05/2016 68   . Albumin 12/05/2016 4.4   . Bilirubin, Total 12/05/2016 0.8   . Protein, Total 12/05/2016 6.7   . A/G Ratio 12/05/2016 1.9   . Cholesterol, Total 12/05/2016 193   . Triglyceride 12/05/2016 202*  . HDL (High Density Lipopr* 12/05/2016 34.1   . LDL (Low Density Lipopro* 12/05/2016 119   . VLDL Cholesterol 12/05/2016 40   . Cholesterol/HDL Ratio 12/05/2016 5.7   . Hemoglobin A1C 12/05/2016 5.7*  . Average Blood Glucose (C* 12/05/2016 117   . Thyroid Stimulating Horm* 12/05/2016 4.844   . Color 12/22/2016 Yellow   . Clarity 12/22/2016 Clear   . Specific Gravity 12/22/2016 1.010   . pH, Urine 12/22/2016 6.0   . Protein, Urinalysis 12/22/2016 Negative   . Glucose, Urinalysis 12/22/2016 Negative   . Ketones, Urinalysis 12/22/2016   Negative   . Blood, Urinalysis 12/22/2016 Negative   . Nitrite, Urinalysis 12/22/2016 Negative   . Leukocyte Esterase, Urin* 12/22/2016 Negative   . White Blood Cells, Urina* 12/22/2016 None Seen   . Red Blood Cells, Urinaly* 12/22/2016 None Seen   . Bacteria, Urinalysis 12/22/2016 None Seen   . Squamous Epithelial Cell* 12/22/2016 None Seen      ASSESSMENT: Mr. Trentham is a 77 y.o. male presenting for consultation for left groin pain.    The patient presents with a left groin pain. On physical exam there, a hernia was not able to be appreciated. The physical exam was done on supine and standing position and with valsalva maneuver. There is pain on the left pubic area. This could be to a small fat containing hernia or it may be of inflammation of tendon insertion or weakness of the inguinal floor. Patient refers that he has been doing heavy abdominal exercises over 90 pounds and that might aggravated the pain.   Patient oriented about differential diagnosis of left inguinal hernia, sports hernia and the recommendations of further workup with CT scan. He refers that he had a recent CT scan. There is no CT scan on [facility] or [facility] systems since  2005. Patient oriented about the rationale of the CT scan that if no hernia is appreciated on CT scan, treatment will start with anti inflammatory medications and physical therapy. If it does not improve, surgery may be considered. If Ct scan confirm the hernia, will discuss surgical management.   I personally evaluated the CT scan done on May 2019. There is a small left inguinal hernia that is not mentioned in the report, that can explain patients pain on the left groin. Patient oriented about finding and agreed to proceed with surgery as discussed on initial appointment.   PLAN: 1. Laparoscopic left inguinal hernia repair with mesh (49650) 2. Internal medicine clearance 3. CBC, CMP 4. Do not take aspirin 5 days before surgery.   Patient verbalized understanding, all questions were answered, and were agreeable with the plan outlined above.    Priest Lockridge Cintron-Diaz, MD  Electronically signed by Catheleen Langhorne Cintron-Diaz, MD  

## 2017-12-17 DIAGNOSIS — Z01818 Encounter for other preprocedural examination: Secondary | ICD-10-CM | POA: Diagnosis not present

## 2017-12-17 DIAGNOSIS — E781 Pure hyperglyceridemia: Secondary | ICD-10-CM | POA: Diagnosis not present

## 2017-12-17 DIAGNOSIS — N481 Balanitis: Secondary | ICD-10-CM | POA: Diagnosis not present

## 2017-12-17 DIAGNOSIS — E039 Hypothyroidism, unspecified: Secondary | ICD-10-CM | POA: Diagnosis not present

## 2017-12-24 ENCOUNTER — Other Ambulatory Visit: Payer: Self-pay

## 2017-12-24 ENCOUNTER — Encounter
Admission: RE | Admit: 2017-12-24 | Discharge: 2017-12-24 | Disposition: A | Discharge status: Home / Self Care | Payer: PPO | Source: Ambulatory Visit | Attending: General Surgery | Admitting: General Surgery

## 2017-12-24 DIAGNOSIS — Z01812 Encounter for preprocedural laboratory examination: Secondary | ICD-10-CM | POA: Insufficient documentation

## 2017-12-24 NOTE — Patient Instructions (Addendum)
Your procedure is scheduled IW:[id number] 12/26 Report to Day Surgery. To find out your arrival time please call [phone] between 1PM - 3PM on tues. 12/23.  Remember: Instructions that are not followed completely may result in serious medical risk,  up to and including death, or upon the discretion of your surgeon and anesthesiologist your  surgery may need to be rescheduled.     _X__ 1. Do not eat food after midnight the night before your procedure.                 No gum chewing or hard candies. You may drink clear liquids up to 2 hours                 before you are scheduled to arrive for your surgery- DO not drink clear                 liquids within 2 hours of the start of your surgery.                 Clear Liquids include:  water, apple juice without pulp, clear carbohydrate                 drink such as Clearfast of Gatorade, Black Coffee or Tea (Do not add                 anything to coffee or tea).  __X__2.  On the morning of surgery brush your teeth with toothpaste and water, you                may rinse your mouth with mouthwash if you wish.  Do not swallow any toothpaste of mouthwash.     _X__ 3.  No Alcohol for 24 hours before or after surgery.   ___ 4.  Do Not Smoke or use e-cigarettes For 24 Hours Prior to Your Surgery.                 Do not use any chewable tobacco products for at least 6 hours prior to                 surgery.  ____  5.  Bring all medications with you on the day of surgery if instructed.   __x__  6.  Notify your doctor if there is any change in your medical condition      (cold, fever, infections).     Do not wear jewelry, make-up, hairpins, clips or nail polish. Do not wear lotions, powders, or perfumes. You may wear deodorant. Do not shave 48 hours prior to surgery. Men may shave face and neck. Do not bring valuables to the hospital.    [facility] is not responsible for any belongings or valuables.  Contacts,  dentures or bridgework may not be worn into surgery. Leave your suitcase in the car. After surgery it may be brought to your room. For patients admitted to the hospital, discharge time is determined by your treatment team.   Patients discharged the day of surgery will not be allowed to drive home.   Please read over the following fact sheets that you were given:    __x__ Take these medicines the morning of surgery with A SIP OF WATER:    1. esomeprazole (NEXIUM) 20 MG capsule take extra dose the night before and the morning of surgery  2. levothyroxine (SYNTHROID, LEVOTHROID) 25 MCG tablet  3.   4.  5.  6.  ____ [employer]  Enema (as directed)   __x__ Use CHG Soap as directed  ____ Use inhalers on the day of surgery  ____ Stop metformin 2 days prior to surgery    ____ Take 1/2 of usual insulin dose the night before surgery. No insulin the morning          of surgery.   ____ Stop Coumadin/Plavix/aspirin on   ____ Stop Anti-inflammatories on    ____ Stop supplements until after surgery.    ____ Bring C-Pap to the hospital.

## 2017-12-30 ENCOUNTER — Encounter: Payer: Self-pay | Admitting: Anesthesiology

## 2017-12-30 MED ORDER — CEFAZOLIN SODIUM-DEXTROSE 2-4 GM/100ML-% IV SOLN
2.0000 g | INTRAVENOUS | Status: AC
  Administered 2017-12-31: 2 g via INTRAVENOUS

## 2017-12-31 ENCOUNTER — Ambulatory Visit: Payer: PPO | Admitting: Anesthesiology

## 2017-12-31 ENCOUNTER — Encounter
Admission: RE | Disposition: A | Discharge status: Home / Self Care | Payer: Self-pay | Source: Home / Self Care | Attending: General Surgery

## 2017-12-31 ENCOUNTER — Other Ambulatory Visit: Payer: Self-pay

## 2017-12-31 ENCOUNTER — Observation Stay
Admission: RE | Admit: 2017-12-31 | Discharge: 2018-01-01 | Disposition: A | Discharge status: Home / Self Care | Payer: PPO | Attending: General Surgery | Admitting: General Surgery

## 2017-12-31 ENCOUNTER — Encounter: Payer: Self-pay | Admitting: *Deleted

## 2017-12-31 DIAGNOSIS — K219 Gastro-esophageal reflux disease without esophagitis: Secondary | ICD-10-CM | POA: Insufficient documentation

## 2017-12-31 DIAGNOSIS — M109 Gout, unspecified: Secondary | ICD-10-CM | POA: Diagnosis not present

## 2017-12-31 DIAGNOSIS — E669 Obesity, unspecified: Secondary | ICD-10-CM | POA: Diagnosis not present

## 2017-12-31 DIAGNOSIS — E039 Hypothyroidism, unspecified: Secondary | ICD-10-CM | POA: Insufficient documentation

## 2017-12-31 DIAGNOSIS — Z87891 Personal history of nicotine dependence: Secondary | ICD-10-CM | POA: Insufficient documentation

## 2017-12-31 DIAGNOSIS — D176 Benign lipomatous neoplasm of spermatic cord: Secondary | ICD-10-CM | POA: Diagnosis not present

## 2017-12-31 DIAGNOSIS — Z79899 Other long term (current) drug therapy: Secondary | ICD-10-CM | POA: Insufficient documentation

## 2017-12-31 DIAGNOSIS — M1990 Unspecified osteoarthritis, unspecified site: Secondary | ICD-10-CM | POA: Diagnosis not present

## 2017-12-31 DIAGNOSIS — Z6835 Body mass index (BMI) 35.0-35.9, adult: Secondary | ICD-10-CM | POA: Diagnosis not present

## 2017-12-31 DIAGNOSIS — K4090 Unilateral inguinal hernia, without obstruction or gangrene, not specified as recurrent: Principal | ICD-10-CM | POA: Insufficient documentation

## 2017-12-31 DIAGNOSIS — E781 Pure hyperglyceridemia: Secondary | ICD-10-CM | POA: Diagnosis not present

## 2017-12-31 DIAGNOSIS — R310 Gross hematuria: Secondary | ICD-10-CM | POA: Diagnosis not present

## 2017-12-31 HISTORY — PX: INGUINAL HERNIA REPAIR: SHX194

## 2017-12-31 SURGERY — REPAIR, HERNIA, INGUINAL, LAPAROSCOPIC
Anesthesia: General | Laterality: Left

## 2017-12-31 MED ORDER — DEXAMETHASONE SODIUM PHOSPHATE 10 MG/ML IJ SOLN
INTRAMUSCULAR | Status: DC | PRN
  Administered 2017-12-31: 5 mg via INTRAVENOUS

## 2017-12-31 MED ORDER — ACETAMINOPHEN 10 MG/ML IV SOLN
INTRAVENOUS | Status: DC | PRN
  Administered 2017-12-31: 1000 mg via INTRAVENOUS

## 2017-12-31 MED ORDER — LACTATED RINGERS IV SOLN
INTRAVENOUS | Status: DC
  Administered 2017-12-31: 07:00:00 via INTRAVENOUS

## 2017-12-31 MED ORDER — SODIUM CHLORIDE 0.9 % IV SOLN
INTRAVENOUS | Status: DC
  Administered 2017-12-31 – 2018-01-01 (×2): via INTRAVENOUS

## 2017-12-31 MED ORDER — ONDANSETRON HCL 4 MG/2ML IJ SOLN
INTRAMUSCULAR | Status: DC | PRN
  Administered 2017-12-31: 4 mg via INTRAVENOUS

## 2017-12-31 MED ORDER — MORPHINE SULFATE (PF) 4 MG/ML IV SOLN: 4.0000 mg | INTRAVENOUS | Status: DC | PRN

## 2017-12-31 MED ORDER — ACETAMINOPHEN 10 MG/ML IV SOLN
INTRAVENOUS | Status: AC
  Filled 2017-12-31: qty 100

## 2017-12-31 MED ORDER — HYDROCODONE-ACETAMINOPHEN 5-325 MG PO TABS: 1.0000 | ORAL_TABLET | ORAL | Status: DC | PRN

## 2017-12-31 MED ORDER — ACETAMINOPHEN 650 MG RE SUPP: 650.0000 mg | Freq: Four times a day (QID) | RECTAL | Status: DC | PRN

## 2017-12-31 MED ORDER — LIDOCAINE HCL (CARDIAC) PF 100 MG/5ML IV SOSY
PREFILLED_SYRINGE | INTRAVENOUS | Status: DC | PRN
  Administered 2017-12-31: 100 mg via INTRAVENOUS

## 2017-12-31 MED ORDER — BUPIVACAINE-EPINEPHRINE (PF) 0.5% -1:200000 IJ SOLN
INTRAMUSCULAR | Status: DC | PRN
  Administered 2017-12-31: 10 mL via PERINEURAL

## 2017-12-31 MED ORDER — HYDROCODONE-ACETAMINOPHEN 5-325 MG PO TABS: 1.0000 | ORAL_TABLET | ORAL | 0 refills | Status: AC | PRN

## 2017-12-31 MED ORDER — PROPOFOL 10 MG/ML IV BOLUS
INTRAVENOUS | Status: DC | PRN
  Administered 2017-12-31: 150 mg via INTRAVENOUS

## 2017-12-31 MED ORDER — ACETAMINOPHEN 325 MG PO TABS
650.0000 mg | ORAL_TABLET | Freq: Four times a day (QID) | ORAL | Status: DC | PRN
  Administered 2017-12-31: 650 mg via ORAL
  Filled 2017-12-31: qty 2

## 2017-12-31 MED ORDER — ONDANSETRON HCL 4 MG/2ML IJ SOLN: 4.0000 mg | Freq: Four times a day (QID) | INTRAMUSCULAR | Status: DC | PRN

## 2017-12-31 MED ORDER — ONDANSETRON 4 MG PO TBDP: 4.0000 mg | ORAL_TABLET | Freq: Four times a day (QID) | ORAL | Status: DC | PRN

## 2017-12-31 MED ORDER — PHENYLEPHRINE HCL 10 MG/ML IJ SOLN
INTRAMUSCULAR | Status: DC | PRN
  Administered 2017-12-31 (×4): 100 ug via INTRAVENOUS

## 2017-12-31 MED ORDER — FENTANYL CITRATE (PF) 250 MCG/5ML IJ SOLN
INTRAMUSCULAR | Status: AC
  Filled 2017-12-31: qty 5

## 2017-12-31 MED ORDER — ROCURONIUM BROMIDE 100 MG/10ML IV SOLN
INTRAVENOUS | Status: DC | PRN
  Administered 2017-12-31 (×4): 10 mg via INTRAVENOUS
  Administered 2017-12-31: 50 mg via INTRAVENOUS

## 2017-12-31 MED ORDER — HYDROCODONE-ACETAMINOPHEN 7.5-325 MG PO TABS: 1.0000 | ORAL_TABLET | ORAL | Status: DC | PRN

## 2017-12-31 MED ORDER — ALLOPURINOL 100 MG PO TABS
300.0000 mg | ORAL_TABLET | Freq: Every day | ORAL | Status: DC
  Administered 2017-12-31 – 2018-01-01 (×2): 300 mg via ORAL
  Filled 2017-12-31 (×2): qty 3

## 2017-12-31 MED ORDER — POLYETHYLENE GLYCOL 3350 17 G PO PACK: 17.0000 g | PACK | Freq: Every day | ORAL | Status: DC | PRN

## 2017-12-31 MED ORDER — CEFAZOLIN SODIUM-DEXTROSE 2-4 GM/100ML-% IV SOLN
INTRAVENOUS | Status: AC
  Filled 2017-12-31: qty 100

## 2017-12-31 MED ORDER — FENTANYL CITRATE (PF) 100 MCG/2ML IJ SOLN: 25.0000 ug | INTRAMUSCULAR | Status: DC | PRN

## 2017-12-31 MED ORDER — SUCCINYLCHOLINE CHLORIDE 20 MG/ML IJ SOLN
INTRAMUSCULAR | Status: DC | PRN
  Administered 2017-12-31: 100 mg via INTRAVENOUS

## 2017-12-31 MED ORDER — PANTOPRAZOLE SODIUM 40 MG PO TBEC
40.0000 mg | DELAYED_RELEASE_TABLET | Freq: Every day | ORAL | Status: DC
  Administered 2017-12-31 – 2018-01-01 (×2): 40 mg via ORAL
  Filled 2017-12-31 (×2): qty 1

## 2017-12-31 MED ORDER — SUGAMMADEX SODIUM 200 MG/2ML IV SOLN
INTRAVENOUS | Status: DC | PRN
  Administered 2017-12-31: 200 mg via INTRAVENOUS

## 2017-12-31 MED ORDER — ONDANSETRON HCL 4 MG/2ML IJ SOLN: 4.0000 mg | Freq: Once | INTRAMUSCULAR | Status: DC | PRN

## 2017-12-31 MED ORDER — PROPOFOL 10 MG/ML IV BOLUS
INTRAVENOUS | Status: AC
  Filled 2017-12-31: qty 60

## 2017-12-31 MED ORDER — FENTANYL CITRATE (PF) 100 MCG/2ML IJ SOLN
INTRAMUSCULAR | Status: DC | PRN
  Administered 2017-12-31: 50 ug via INTRAVENOUS
  Administered 2017-12-31: 100 ug via INTRAVENOUS

## 2017-12-31 MED ORDER — LEVOTHYROXINE SODIUM 50 MCG PO TABS
25.0000 ug | ORAL_TABLET | Freq: Every day | ORAL | Status: DC
  Administered 2018-01-01: 25 ug via ORAL
  Filled 2017-12-31: qty 1

## 2017-12-31 MED ORDER — HYDROCODONE-ACETAMINOPHEN 5-325 MG PO TABS
ORAL_TABLET | ORAL | Status: AC
  Administered 2017-12-31: 1
  Filled 2017-12-31: qty 1

## 2017-12-31 MED ORDER — BUPIVACAINE-EPINEPHRINE (PF) 0.5% -1:200000 IJ SOLN
INTRAMUSCULAR | Status: AC
  Filled 2017-12-31: qty 90

## 2017-12-31 SURGICAL SUPPLY — 48 items
ADH SKN CLS APL DERMABOND .7 (GAUZE/BANDAGES/DRESSINGS) ×1
APPLIER CLIP LOGIC TI 5 (MISCELLANEOUS) ×2 IMPLANT
APR CLP MED LRG 33X5 (MISCELLANEOUS) ×1
BALN DSCT LAPSCP LRG KNDY DSTN (BALLOONS) ×1
BLADE CLIPPER SURG (BLADE) ×3 IMPLANT
BLADE SURG SZ11 CARB STEEL (BLADE) ×3 IMPLANT
CANISTER SUCT 1200ML W/VALVE (MISCELLANEOUS) ×3 IMPLANT
CATH COUDE FOLEY 5CC 14FR (CATHETERS) ×2 IMPLANT
CHLORAPREP W/TINT 26ML (MISCELLANEOUS) ×3 IMPLANT
COVER WAND RF STERILE (DRAPES) ×3 IMPLANT
DERMABOND ADVANCED (GAUZE/BANDAGES/DRESSINGS) ×2
DERMABOND ADVANCED .7 DNX12 (GAUZE/BANDAGES/DRESSINGS) ×1 IMPLANT
DEVICE SECURE STRAP 25 ABSORB (INSTRUMENTS) ×3 IMPLANT
DISSECT BALLN SPACEMKR OVL PDB (BALLOONS) ×3
DISSECTOR BALLN SPCMKR OVL PDB (BALLOONS) ×1 IMPLANT
ELECT REM PT RETURN 9FT ADLT (ELECTROSURGICAL) ×3
ELECTRODE REM PT RTRN 9FT ADLT (ELECTROSURGICAL) ×1 IMPLANT
GAUZE SPONGE 4X4 12PLY STRL (GAUZE/BANDAGES/DRESSINGS) ×3 IMPLANT
GLOVE BIOGEL M 6.5 STRL (GLOVE) ×3 IMPLANT
GOWN STRL REUS W/ TWL LRG LVL3 (GOWN DISPOSABLE) ×2 IMPLANT
GOWN STRL REUS W/TWL LRG LVL3 (GOWN DISPOSABLE) ×6
IRRIGATION STRYKERFLOW (MISCELLANEOUS) IMPLANT
IRRIGATOR STRYKERFLOW (MISCELLANEOUS) ×3
IV NS 1000ML (IV SOLUTION) ×3
IV NS 1000ML BAXH (IV SOLUTION) IMPLANT
JELLY LUB 2OZ STRL (MISCELLANEOUS) ×2
JELLY LUBE 2OZ STRL (MISCELLANEOUS) ×1 IMPLANT
KIT TURNOVER KIT A (KITS) ×3 IMPLANT
KITTNER LAPARASCOPIC 5X40 (MISCELLANEOUS) ×5 IMPLANT
LABEL OR SOLS (LABEL) ×3 IMPLANT
MESH 3DMAX 4X6 LT LRG (Mesh General) ×2 IMPLANT
NDL FILTER BLUNT 18X1 1/2 (NEEDLE) ×1 IMPLANT
NDL HPO THNWL 1X22GA REG BVL (NEEDLE) ×1 IMPLANT
NEEDLE FILTER BLUNT 18X 1/2SAF (NEEDLE) ×2
NEEDLE FILTER BLUNT 18X1 1/2 (NEEDLE) ×1 IMPLANT
NEEDLE SAFETY 22GX1 (NEEDLE) ×3
NS IRRIG 500ML POUR BTL (IV SOLUTION) ×3 IMPLANT
PACK LAP CHOLECYSTECTOMY (MISCELLANEOUS) ×3 IMPLANT
PENCIL ELECTRO HAND CTR (MISCELLANEOUS) ×3 IMPLANT
SCISSORS METZENBAUM CVD 33 (INSTRUMENTS) IMPLANT
SEAL FOR SCOPE WARMER C3101 (MISCELLANEOUS) ×3 IMPLANT
SUT MNCRL AB 4-0 PS2 18 (SUTURE) ×2 IMPLANT
SUT VICRYL 0 AB UR-6 (SUTURE) ×3 IMPLANT
SYR 5ML LL (SYRINGE) ×3 IMPLANT
TRAY FOLEY MTR SLVR 16FR STAT (SET/KITS/TRAYS/PACK) ×3 IMPLANT
TROCAR 5MM SINGLE VERSAONE (TROCAR) ×6 IMPLANT
TROCAR BALLN 10M OMST10SB SPAC (TROCAR) ×3 IMPLANT
TUBING INSUFFLATION (TUBING) ×3 IMPLANT

## 2017-12-31 NOTE — Care Management Obs Status (Signed)
[facility] NOTIFICATION   Patient Details  Name: Tyler Gordon MRN: [id number] Date of Birth: Oct 25, 1940   Medicare Observation Status Notification Given:  Yes    Katrina Stack, RN 12/31/2017, 6:31 PM

## 2017-12-31 NOTE — Discharge Instructions (Addendum)
°  Diet: Resume home heart healthy regular diet. Drink plenty of water  Activity: No heavy lifting >20 pounds (children, pets, laundry, garbage) or strenuous activity until follow-up, but light activity and walking are encouraged. Do not drive or drink alcohol if taking narcotic pain medications.  Wound care: May shower with soapy water and pat dry (do not rub incisions), but no baths or submerging incision underwater until follow-up. (no swimming)   Swelling and bruises of the scrotum and testicle is expected.   Medications: Resume all home medications. For mild to moderate pain: acetaminophen (Tylenol) or ibuprofen (if no kidney disease). Combining Tylenol with alcohol can substantially increase your risk of causing liver disease. Narcotic pain medications, if prescribed, can be used for severe pain, though may cause nausea, constipation, and drowsiness. Do not combine Tylenol and Norco within a 6 hour period as Norco contains Tylenol. If you do not need the narcotic pain medication, you do not need to fill the prescription.  Call office [phone]) at any time if any questions, worsening pain, fevers/chills, bleeding, drainage from incision site, or other concerns.  AMBULATORY SURGERY  DISCHARGE INSTRUCTIONS   1) The drugs that you were given will stay in your system until tomorrow so for the next 24 hours you should not:  A) Drive an automobile B) Make any legal decisions C) Drink any alcoholic beverage   2) You may resume regular meals tomorrow.  Today it is better to start with liquids and gradually work up to solid foods.  You may eat anything you prefer, but it is better to start with liquids, then soup and crackers, and gradually work up to solid foods.   3) Please notify your doctor immediately if you have any unusual bleeding, trouble breathing, redness and pain at the surgery site, drainage, fever, or pain not relieved by medication.    4) Additional  Instructions:        Please contact your physician with any problems or Same Day Surgery at [phone], Monday through Friday 6 am to 4 pm, or [facility] number at [phone].

## 2017-12-31 NOTE — Op Note (Signed)
Preoperative diagnosis: Left inguinal hernia.                                                Postoperative diagnosis: Left inguinal hernia.                                                Procedure: Laparoscopic Total extraperitoneal laparoscopic (TEP) repair of left inguinal hernia.   Anesthesia: GETA   Surgeon: Dr. Windell Moment   Assistant Surgeon: Dr. Lysle Pearl   Wound Classification: Clean  Indications:  A 77 year old male with left symptomatic inguinal hernia.    Findings: 1. Left Inguinal hernia identified.  2. Vas deferens and cord structures identified and preserved 3. Large Bard 3D Max mesh used for repair 4. Adequate hemostasis.    Description of procedure: The patient was taken to the operating room and the correct side of surgery was verified. The patient was placed supine with arms tucked at the sides. After obtaining adequate anesthesia, the patient's abdomen was prepped and draped in standard sterile fashion. The patient was placed in the Trendelenburg position. A time-out was completed verifying correct patient, procedure, site, positioning, and implant(s) and/or special equipment prior to beginning this procedure.  Periumbilical cutdown was done down to anterior fascia. The anterior fascia was incised. Blunt dissection with finger was done below the anterior fascia between the rectus muscle. A spacer was inserted and insufflated under direct visualization. An 11-mm trocar was placed infraumbilically and the 30 angled laparoscope inserted. Two 5-mm trocars were then placed, one suprapubic and another one between both trocars. Left inguinal area inspected. Dissection was started medial to lateral above the pubis ramus. The inferior epigastric vessels were exposed and the pubic symphysis was identified. The peritoneal flap was mobilized inferiorly using blunt and sharp dissection.  Cooper's ligament was dissected to its junction with the iliac vein. The dissection was continued  inferiorly to the iliopubic tract, with care taken to avoid injury to the femoral branch of the genitofemoral nerve and the lateral femoral cutaneous nerve. The cord structures were parietalized. A large hernia was identified and reduced by gentle traction. The indirect hernia sac was noted mobilized from the cord structures and reduced including a small lipoma of the cord. A Large Left Bard 3D Max piece of mesh was rolled longitudinally into a compact cylinder and passed through a trocar. The cylinder was placed along the inferior aspect of the working space and unrolled into place to completely cover the direct, indirect, and femoral spaces. The mesh was secured into place superiorly to the anterior abdominal wall and inferiorly and medially to Cooper's ligament with absorbable tacks. Care was taken to avoid the inferolateral triangles containing the iliac vessels and genital nerves. The anterior fascia of the periumbilical incision was also closed with a figure of 8 vicryl 0 suture.  The trocar incisions were closed using monocryl and skin adhesive dressings applied.  The patient tolerated the procedure well and was taken to the postanesthesia care unit in stable condition.    Specimen: None   Complications: Traumatic foley placement with hematuria. Treatment with foley catheter in place for 24 hours   Estimated Blood Loss: 15 mL

## 2017-12-31 NOTE — Interval H&P Note (Signed)
History and Physical Interval Note:  12/31/2017 7:00 AM  Tyler Gordon  has presented today for surgery, with the diagnosis of NON RECURRENT UNILATERAL INGUINAL HERNIA W/O OBSTRUCTION OR GANGRENE  The various methods of treatment have been discussed with the patient and family. After consideration of risks, benefits and other options for treatment, the patient has consented to  Procedure(s): LAPAROSCOPIC INGUINAL HERNIA (Left) as a surgical intervention .  The patient's history has been reviewed, patient examined, no change in status, stable for surgery.  I have reviewed the patient's chart and labs. Left side inguinal area marked in the pre procedure room. Questions were answered to the patient's satisfaction.     Herbert Pun

## 2017-12-31 NOTE — Anesthesia Preprocedure Evaluation (Addendum)
Anesthesia Evaluation  Patient identified by MRN, date of birth, ID band Patient awake    Reviewed: Allergy & Precautions, NPO status , Patient's Chart, lab work & pertinent test results, reviewed documented beta blocker date and time   Airway Mallampati: III  TM Distance: >3 FB     Dental  (+) Chipped, Upper Dentures, Missing   Pulmonary pneumonia, resolved, former smoker,           Cardiovascular      Neuro/Psych    GI/Hepatic GERD  Controlled,  Endo/Other    Renal/GU      Musculoskeletal  (+) Arthritis ,   Abdominal   Peds  Hematology   Anesthesia Other Findings Obese. Gout.  Reproductive/Obstetrics                            Anesthesia Physical Anesthesia Plan  ASA: III  Anesthesia Plan: General   Post-op Pain Management:    Induction: Intravenous  PONV Risk Score and Plan:   Airway Management Planned: Oral ETT  Additional Equipment:   Intra-op Plan:   Post-operative Plan:   Informed Consent: I have reviewed the patients History and Physical, chart, labs and discussed the procedure including the risks, benefits and alternatives for the proposed anesthesia with the patient or authorized representative who has indicated his/her understanding and acceptance.     Plan Discussed with: CRNA  Anesthesia Plan Comments:         Anesthesia Quick Evaluation

## 2017-12-31 NOTE — Brief Op Note (Signed)
12/31/2017  11:08 AM  PATIENT:  Tyler Gordon  77 y.o. male  PRE-OPERATIVE DIAGNOSIS:  NON RECURRENT UNILATERAL INGUINAL HERNIA W/O OBSTRUCTION OR GANGRENE  POST-OPERATIVE DIAGNOSIS:  NON RECURRENT UNILATERAL INGUINAL HERNIA W/O OBSTRUCTION OR GANGRENE  PROCEDURE:  Procedure(s): LAPAROSCOPIC INGUINAL HERNIA (Left)  SURGEON:  Surgeon(s) and Role:    * Herbert Pun, MD - Primary    * Sakai, Isami, DO - Assisting  ANESTHESIA:   local and general  EBL:  15 mL   DRAINS: Urinary Catheter (Foley)   SPECIMEN:  No Specimen  COUNTS:  YES  PLAN OF CARE: Admit for overnight observation. Patient with gross hematuria and pain control. Patient will not be able to manage the foley at home and control the pain. Will keep the patient overnight. Urology evaluated the patient and recommended to keep foley in place for 24 hours and doing voiding trial tomorrow.   PATIENT DISPOSITION:  PACU - hemodynamically stable.   Delay start of Pharmacological VTE agent (>24hrs) due to surgical blood loss or risk of bleeding: yes

## 2017-12-31 NOTE — Transfer of Care (Signed)
Immediate Anesthesia Transfer of Care Note  Patient: Tyler Gordon  Procedure(s) Performed: LAPAROSCOPIC INGUINAL HERNIA (Left )  Patient Location: PACU  Anesthesia Type:General  Level of Consciousness: awake, drowsy and patient cooperative  Airway & Oxygen Therapy: Patient Spontanous Breathing and Patient connected to face mask oxygen  Post-op Assessment: Report given to RN, Post -op Vital signs reviewed and stable and Patient moving all extremities  Post vital signs: Reviewed and stable  Last Vitals:  Vitals Value Taken Time  BP 161/101 12/31/2017  9:50 AM  Temp 36.7 C 12/31/2017  9:50 AM  Pulse 85 12/31/2017  9:53 AM  Resp 12 12/31/2017  9:53 AM  SpO2 96 % 12/31/2017  9:53 AM  Vitals shown include unvalidated device data.  Last Pain:  Vitals:   12/31/17 0950  TempSrc:   PainSc: Asleep         Complications: No apparent anesthesia complications

## 2017-12-31 NOTE — Anesthesia Post-op Follow-up Note (Signed)
Anesthesia QCDR form completed.        

## 2017-12-31 NOTE — Consult Note (Signed)
UROLOGY CONSULT NOTE  Reason for consult: Hematuria post lap inguinal hernia repair  I was asked to see Tyler Gordon by Dr. Windell Moment for hematuria in the foley after laparoscopic inguinal hernia repair. History notable for TURP with Dr. Jeffie Pollock in 2012. Minimal history able to be obtained from patient secondary to post-op sedation. He does feel he has had a weaker urinary stream over the last few months.  In the OR, they were reportedly unable to place a 84F foley, and a 59F coude was placed with pink to red urine.  In PACU, urine is light pink in the foley tubing, no clots, bladder scan 0cc.  Recommendations: -Maintain foley at discharge, patient refused overnight observation offered by general surgery -Push fluids at home to keep catheter flushed -Follow up tomorrow in urology clinic for foley removal and void trial (arranged) -Recommend outpatient cysto in 2-3 weeks to evaluate for prostatic re-growth/bladder neck contracture/urethral stricture  Discussed with patient, his son, and Dr. Windell Moment.  Nickolas Madrid, MD 12/31/2017

## 2017-12-31 NOTE — Anesthesia Procedure Notes (Signed)
Procedure Name: Intubation Date/Time: 12/31/2017 7:38 AM Performed by: Lowry Bowl, CRNA Pre-anesthesia Checklist: Patient identified, Emergency Drugs available, Suction available and Patient being monitored Patient Re-evaluated:Patient Re-evaluated prior to induction Oxygen Delivery Method: Circle system utilized Preoxygenation: Pre-oxygenation with 100% oxygen Induction Type: IV induction and Cricoid Pressure applied Ventilation: Mask ventilation without difficulty and Oral airway inserted - appropriate to patient size Laryngoscope Size: Mac and 4 Grade View: Grade I Tube type: Oral Tube size: 7.5 mm Number of attempts: 1 Airway Equipment and Method: Stylet Placement Confirmation: ETT inserted through vocal cords under direct vision,  positive ETCO2 and breath sounds checked- equal and bilateral Secured at: 22 cm Tube secured with: Tape Dental Injury: Teeth and Oropharynx as per pre-operative assessment

## 2017-12-31 NOTE — OR Nursing (Signed)
Patient advises 11 am "he said I could stay if I wanted to and I want to.  Dr. Windell Moment notified of same and in to see patient.  Advises he will put admission orders in system.

## 2018-01-01 DIAGNOSIS — K4090 Unilateral inguinal hernia, without obstruction or gangrene, not specified as recurrent: Secondary | ICD-10-CM | POA: Diagnosis not present

## 2018-01-01 NOTE — Progress Notes (Signed)
UROLOGY PROGRESS NOTE  Kept overnight for observation in setting of hematuria and difficulty catheter placement during laparoscopic inguinal hernia repair with general surgery.  Patient denies any complaints overnight.  Urine is clear yellow in the catheter today.  Remove Foley and void trial today, check PVRs prior to discharge Follow-up with urology in 2 to 3 weeks(arranged) Call if questions  Nickolas Madrid, MD 01/01/2018

## 2018-01-01 NOTE — Anesthesia Postprocedure Evaluation (Signed)
Anesthesia Post Note  Patient: Tyler Gordon  Procedure(s) Performed: LAPAROSCOPIC INGUINAL HERNIA (Left )  Patient location during evaluation: PACU Anesthesia Type: General Level of consciousness: awake and alert Pain management: pain level controlled Vital Signs Assessment: post-procedure vital signs reviewed and stable Respiratory status: spontaneous breathing, nonlabored ventilation, respiratory function stable and patient connected to nasal cannula oxygen Cardiovascular status: blood pressure returned to baseline and stable Postop Assessment: no apparent nausea or vomiting Anesthetic complications: no     Last Vitals:  Vitals:   12/31/17 2049 01/01/18 0333  BP: (!) 147/83 (!) 165/73  Pulse: 92 83  Resp: 16 16  Temp: 36.7 C (!) 36.3 C  SpO2: 93% 96%    Last Pain:  Vitals:   01/01/18 0844  TempSrc:   PainSc: 0-No pain                 Waynette Towers S

## 2018-01-01 NOTE — Discharge Summary (Signed)
  Patient ID: Tyler Gordon MRN: [id number] DOB/AGE: 09-Sep-1940 77 y.o.  Admit date: 12/31/2017 Discharge date: 01/01/2018   Discharge Diagnoses:  Active Problems:   Gross hematuria   Status post Laparoscopic inguinal hernia repair with mesh  Procedures:Laparoscopic left inguinal hernia repair with mesh  Hospital Course: patient came for Laparoscopic inguinal hernia repair with mesh. During foley placement developed bleeding. Due to hematuria, Urology was consulted and recommended keeping foley for 24 hours. Foley removed today, urine was yellow. Patient voiding spontaneously, ambulating, tolerating diet.   Physical Exam  Constitutional: He is well-developed, well-nourished, and in no distress.  Cardiovascular: Normal rate and regular rhythm.  Pulmonary/Chest: Effort normal.  Abdominal: Soft. Bowel sounds are normal. He exhibits no distension.  Genitourinary:    Penis normal.   Testicular swelling without ecchymosis. Non tender.    Consults: Urology  Disposition: Discharge disposition: 01-Home or Self Care  Discharge Instructions    Diet - low sodium heart healthy   Complete by:  As directed    Diet - low sodium heart healthy   Complete by:  As directed    Increase activity slowly   Complete by:  As directed      Allergies as of 01/01/2018   No Known Allergies     Medication List    TAKE these medications   allopurinol 300 MG tablet Commonly known as:  ZYLOPRIM Take 300 mg by mouth daily.   clotrimazole-betamethasone cream Commonly known as:  LOTRISONE Apply 1 application topically 2 (two) times daily.   esomeprazole 20 MG capsule Commonly known as:  NEXIUM Take 20 mg by mouth every morning.   HYDROcodone-acetaminophen 5-325 MG tablet Commonly known as:  NORCO Take 1 tablet by mouth every 4 (four) hours as needed for up to 3 days for moderate pain.   levothyroxine 25 MCG tablet Commonly known as:  SYNTHROID, LEVOTHROID Take 25 mcg by mouth daily before  breakfast.   nystatin powder Commonly known as:  MYCOSTATIN/NYSTOP Apply 1 application topically 2 (two) times daily.      Follow-up Information    Billey Co, MD Follow up in 2 week(s).   Specialty:  Urology Contact information: [place] [address] [phone]        Herbert Pun, MD Follow up in 2 week(s).   Specialty:  General Surgery Why:  office closed today - call tomorrow or Monday to schedule a two week follow up with Dr. Grier Rocher information: [place] [address] [phone]

## 2018-01-01 NOTE — Progress Notes (Signed)
Discharge instructions reviewed with patient including followup visits and new medications.  Understanding was verbalized and all questions were answered.  IV removed without complication; patient tolerated well.  Patient discharged home ambulatory in stable condition.

## 2018-01-14 ENCOUNTER — Telehealth: Payer: Self-pay | Admitting: Urology

## 2018-01-14 NOTE — Telephone Encounter (Signed)
Spoke with the patient and he will come by the office to sign a medical release form   Sharyn Lull

## 2018-01-14 NOTE — Telephone Encounter (Signed)
-----   Message from Billey Co, MD sent at 01/01/2018  7:39 AM EST ----- Regarding: [facility] records He needs follow up with me in 2-3 weeks with PVR.  Please obtain his outside urology records from Dr. Jeffie Pollock at [facility] from the last 2 years, sounds like he had a cystoscopy there last December 2018. Thanks  Nickolas Madrid, MD 01/01/2018

## 2018-01-18 ENCOUNTER — Ambulatory Visit: Payer: PPO | Admitting: Urology

## 2018-01-18 ENCOUNTER — Encounter: Payer: Self-pay | Admitting: Urology

## 2018-01-18 VITALS — BP 143/83 | HR 94 | Ht 65.0 in | Wt 208.0 lb

## 2018-01-18 DIAGNOSIS — R310 Gross hematuria: Secondary | ICD-10-CM | POA: Diagnosis not present

## 2018-01-18 LAB — BLADDER SCAN AMB NON-IMAGING

## 2018-01-18 MED ORDER — FINASTERIDE 5 MG PO TABS: 5.0000 mg | ORAL_TABLET | Freq: Every day | ORAL | 11 refills | Status: DC

## 2018-01-18 NOTE — Progress Notes (Signed)
01/18/2018 3:21 PM   Tyler Gordon Jun 20, 1940 [id number]  Referring provider: Tracie Harrier, MD [address] [place], [place] [address]  CC: Hematuria   HPI: I saw Mr. Tyler Gordon in urology clinic for hematuria.  I recently saw him in the hospital on 12/31/2017 on postop day #0 from a left laparoscopic inguinal hernia repair with Dr. Windell Moment.  Intraoperatively he had a attempted 58 French Foley placement which was met with resistance, and ultimately a 66 [place] coud was placed with return of pinkish-red urine.  He was admitted and monitored overnight with the hematuria, however urine was clear yellow in the morning, catheter was removed, and he passed a voiding trial prior to discharge.  His urologic history is notable for a TURP with Dr. Jeffie Pollock in 2012.  Notably, he had developed recurrent gross hematuria over the last year, and ultimately underwent hematuria work-up in May 2019 with Dr. Jeffie Pollock at [facility] urology in [place].  CT urogram did not show any abnormal findings, and cystoscopy was notable for "borderline obstructing prostate.  Severe hyperplasia.  Partially resected prostate fossa.  Mild prostatic regrowth."  It was felt that his hematuria was secondary to friable prostatic tissue at that time.  He denies any urinary symptoms and reports he voids with a strong stream.  He reports he has had no further hematuria since discharge from the hospital.  There are no aggravating or alleviating factors.  Severity is mild.  He also reports some left scrotal swelling since his surgery.  This is not painful, but it is bulky and moderately bothersome.  PVR in clinic today is 0 cc.   PMH: Past Medical History:  Diagnosis Date  . Arthritis 11-22-10   hx. gout(foot) , no problems in 7 yrs  . Benign prostatic hypertrophy 11-22-10   Foley catheter at present, surgery is planned  . GERD (gastroesophageal reflux disease) 11-22-10   reflux-tx. Baking Soda  daily  . Gout   . Pneumonia 11-22-10   pneumonia x2, none in 89yr.  . Psoriasis 11-22-10   occ. outbreaks    Surgical History: Past Surgical History:  Procedure Laterality Date  . INGUINAL HERNIA REPAIR Left 12/31/2017   Procedure: LAPAROSCOPIC INGUINAL HERNIA;  Surgeon: CHerbert Pun MD;  Location: [facility] ORS;  Service: General;  Laterality: Left;  . PROSTATE SURGERY    . VASECTOMY  11-22-10    Allergies: No Known Allergies  Family History: No family history on file.  Social History:  reports that he quit smoking about 37 years ago. He has never used smokeless tobacco. He reports current alcohol use of about 2.0 standard drinks of alcohol per week. He reports that he does not use drugs.  ROS: Please see flowsheet from today's date for complete review of systems.  Physical Exam: BP (!) 143/83 (BP Location: Left Arm, Patient Position: Sitting)   Pulse 94   Ht _0  (1.651 m)   Wt 208 lb (94.3 kg)   BMI 34.61 kg/m    Constitutional:  Alert and oriented, No acute distress. Cardiovascular: No clubbing, cyanosis, or edema. Respiratory: Normal respiratory effort, no increased work of breathing. GI: Abdomen is soft, nontender, nondistended, no abdominal masses GU: No CVA tenderness, phallus without lesions, left scrotal swelling consistent with postop swelling, no testicular mass Lymph: No cervical or inguinal lymphadenopathy. Skin: No rashes, bruises or suspicious lesions. Neurologic: Grossly intact, no focal deficits, moving all 4 extremities. Psychiatric: Normal mood and affect.  Laboratory Data: None to review  Pertinent Imaging: None to review  Assessment & Plan:   In summary, the patient is a 78 year old male who was previously a patient of Dr. Jeffie Pollock at [facility] urology in [place].  He underwent a TURP in 2012 with good results.  He had episode of hematuria during catheter placement for recent inguinal hernia repair, which has since resolved.  He is  voiding well with clear yellow urine and a PVR of 0 cc.  He does have a history of gross hematuria with recent negative work-up in May 2019 with Dr. Jeffie Pollock.  Hematuria was felt to be secondary to mild prostatic regrowth, with friable tissue.  We discussed the options moving forward including repeating hematuria work-up versus addition of finasteride for his friable prostatic tissue.  He would like to trial finasteride.  I discussed that if he develops recurrent gross hematuria or clot retention, would certainly need to consider repeating cystoscopy and/or possible repeat TURP of any friable prostatic tissue.  RTC 6 months for IPSS/PVR Start finasteride 5 mg daily  Billey Co, MD  [facility] [address] [place], [place] [address] [phone]

## 2018-01-29 ENCOUNTER — Other Ambulatory Visit
Admission: RE | Admit: 2018-01-29 | Discharge: 2018-01-29 | Disposition: A | Discharge status: Home / Self Care | Payer: PPO | Source: Ambulatory Visit | Attending: Student | Admitting: Student

## 2018-01-29 DIAGNOSIS — M25562 Pain in left knee: Secondary | ICD-10-CM | POA: Diagnosis not present

## 2018-01-29 DIAGNOSIS — M25462 Effusion, left knee: Secondary | ICD-10-CM | POA: Diagnosis not present

## 2018-01-29 DIAGNOSIS — M1712 Unilateral primary osteoarthritis, left knee: Secondary | ICD-10-CM | POA: Diagnosis not present

## 2018-01-29 LAB — SYNOVIAL CELL COUNT + DIFF, W/ CRYSTALS
Crystals, Fluid: NONE SEEN
Eosinophils-Synovial: 0 %
Lymphocytes-Synovial Fld: 77 %
Monocyte-Macrophage-Synovial Fluid: 11 %
NEUTROPHIL, SYNOVIAL: 12 %
WBC, Synovial: 1275 /mm3 — ABNORMAL HIGH (ref 0–200)

## 2018-02-12 DIAGNOSIS — R9431 Abnormal electrocardiogram [ECG] [EKG]: Secondary | ICD-10-CM | POA: Diagnosis not present

## 2018-02-12 DIAGNOSIS — I10 Essential (primary) hypertension: Secondary | ICD-10-CM | POA: Diagnosis not present

## 2018-02-12 DIAGNOSIS — Z9889 Other specified postprocedural states: Secondary | ICD-10-CM | POA: Diagnosis not present

## 2018-02-12 DIAGNOSIS — E781 Pure hyperglyceridemia: Secondary | ICD-10-CM | POA: Diagnosis not present

## 2018-02-12 DIAGNOSIS — E039 Hypothyroidism, unspecified: Secondary | ICD-10-CM | POA: Diagnosis not present

## 2018-02-12 DIAGNOSIS — Z0000 Encounter for general adult medical examination without abnormal findings: Secondary | ICD-10-CM | POA: Diagnosis not present

## 2018-07-22 ENCOUNTER — Ambulatory Visit: Payer: PPO | Admitting: Urology

## 2018-08-02 DIAGNOSIS — M7042 Prepatellar bursitis, left knee: Secondary | ICD-10-CM | POA: Diagnosis not present

## 2018-08-02 DIAGNOSIS — M1712 Unilateral primary osteoarthritis, left knee: Secondary | ICD-10-CM | POA: Diagnosis not present

## 2018-08-02 DIAGNOSIS — M25462 Effusion, left knee: Secondary | ICD-10-CM | POA: Diagnosis not present

## 2018-08-02 DIAGNOSIS — M7052 Other bursitis of knee, left knee: Secondary | ICD-10-CM | POA: Diagnosis not present

## 2018-08-13 DIAGNOSIS — Z8739 Personal history of other diseases of the musculoskeletal system and connective tissue: Secondary | ICD-10-CM | POA: Insufficient documentation

## 2018-08-13 DIAGNOSIS — E781 Pure hyperglyceridemia: Secondary | ICD-10-CM | POA: Diagnosis not present

## 2018-08-13 DIAGNOSIS — Z6836 Body mass index (BMI) 36.0-36.9, adult: Secondary | ICD-10-CM | POA: Diagnosis not present

## 2018-08-13 DIAGNOSIS — R7309 Other abnormal glucose: Secondary | ICD-10-CM | POA: Diagnosis not present

## 2018-08-13 DIAGNOSIS — Z125 Encounter for screening for malignant neoplasm of prostate: Secondary | ICD-10-CM | POA: Diagnosis not present

## 2018-08-13 DIAGNOSIS — J301 Allergic rhinitis due to pollen: Secondary | ICD-10-CM | POA: Diagnosis not present

## 2018-08-13 DIAGNOSIS — Z9889 Other specified postprocedural states: Secondary | ICD-10-CM | POA: Diagnosis not present

## 2018-08-13 DIAGNOSIS — E039 Hypothyroidism, unspecified: Secondary | ICD-10-CM | POA: Diagnosis not present

## 2018-09-27 DIAGNOSIS — E039 Hypothyroidism, unspecified: Secondary | ICD-10-CM | POA: Diagnosis not present

## 2018-09-27 DIAGNOSIS — J301 Allergic rhinitis due to pollen: Secondary | ICD-10-CM | POA: Diagnosis not present

## 2018-09-27 DIAGNOSIS — Z8719 Personal history of other diseases of the digestive system: Secondary | ICD-10-CM | POA: Diagnosis not present

## 2018-09-27 DIAGNOSIS — N138 Other obstructive and reflux uropathy: Secondary | ICD-10-CM | POA: Diagnosis not present

## 2018-09-27 DIAGNOSIS — M109 Gout, unspecified: Secondary | ICD-10-CM | POA: Diagnosis not present

## 2018-09-27 DIAGNOSIS — N401 Enlarged prostate with lower urinary tract symptoms: Secondary | ICD-10-CM | POA: Diagnosis not present

## 2018-09-27 DIAGNOSIS — E781 Pure hyperglyceridemia: Secondary | ICD-10-CM | POA: Diagnosis not present

## 2019-08-01 ENCOUNTER — Ambulatory Visit: Payer: Medicare Other | Admitting: Urology

## 2019-08-01 ENCOUNTER — Other Ambulatory Visit: Payer: Self-pay | Admitting: *Deleted

## 2019-08-01 ENCOUNTER — Encounter: Payer: Self-pay | Admitting: Urology

## 2019-08-01 ENCOUNTER — Other Ambulatory Visit: Payer: Self-pay

## 2019-08-01 ENCOUNTER — Other Ambulatory Visit
Admission: RE | Admit: 2019-08-01 | Discharge: 2019-08-01 | Disposition: A | Discharge status: Home / Self Care | Payer: Medicare Other | Attending: Urology | Admitting: Urology

## 2019-08-01 VITALS — BP 149/80 | HR 80 | Ht 65.0 in | Wt 210.0 lb

## 2019-08-01 DIAGNOSIS — R310 Gross hematuria: Secondary | ICD-10-CM | POA: Insufficient documentation

## 2019-08-01 LAB — URINALYSIS, COMPLETE (UACMP) WITH MICROSCOPIC: RBC / HPF: >50 RBC/hpf (ref 0–5)

## 2019-08-01 LAB — BLADDER SCAN AMB NON-IMAGING: Scan Result: 73

## 2019-08-01 MED ORDER — SULFAMETHOXAZOLE-TRIMETHOPRIM 800-160 MG PO TABS: 1.0000 | ORAL_TABLET | Freq: Two times a day (BID) | ORAL | 0 refills | Status: DC

## 2019-08-01 MED ORDER — TAMSULOSIN HCL 0.4 MG PO CAPS: 0.4000 mg | ORAL_CAPSULE | Freq: Every day | ORAL | 3 refills | Status: DC

## 2019-08-01 NOTE — Progress Notes (Signed)
08/01/2019 1:45 PM   Admiral K Pedraza Dec 05, 1940 [id number]  Referring provider: Tracie Harrier, MD [address] [facility] [place],  [place] [address]  Chief Complaint  Patient presents with  . Follow-up    hematuria    HPI: Tyler Gordon is a 79 y.o. with gross hematuria who presents today for an urgent appointment for gross hematuria, passing clots and difficulty urinating.    TURP 2021 with Dr. Jeffie Pollock.  Hematuria work-up in May 2019 with Dr. Jeffie Pollock at [facility] urology in [place].  CT urogram did not show any abnormal findings, and cystoscopy was notable for "borderline obstructing prostate.  Severe hyperplasia.  Partially resected prostate fossa.  Mild prostatic regrowth."  With Dr. Jeffie Pollock.   Was last seen by Dr. Diamantina Providence in 01/2018 for similar complaints.  He was started on finasteride 5 mg daily and was to return to clinic in 6 months for  I PSS and PVR, but he did not follow up as scheduled.    The gross hematuria started over the weekend.  He has passed one clot.  It has been intermittent.  The stream is slowing down.  He didn't take the finasteride, because it affected him funny.  He couldn't be more specific.  His PVR is 73 mL.  UA today is brown turbid, 0-5 squames, 11-20 WBC's, > 50 RBC's and a few bacteria.     PMH: Past Medical History:  Diagnosis Date  . Arthritis 11-22-10   hx. gout(foot) , no problems in 7 yrs  . Benign prostatic hypertrophy 11-22-10   Foley catheter at present, surgery is planned  . GERD (gastroesophageal reflux disease) 11-22-10   reflux-tx. Baking Soda daily  . Gout   . Pneumonia 11-22-10   pneumonia x2, none in 8yrs.  . Psoriasis 11-22-10   occ. outbreaks    Surgical History: Past Surgical History:  Procedure Laterality Date  . INGUINAL HERNIA REPAIR Left 12/31/2017   Procedure: LAPAROSCOPIC INGUINAL HERNIA;  Surgeon: Herbert Pun, MD;  Location: [facility] ORS;  Service: General;  Laterality: Left;  .  PROSTATE SURGERY    . VASECTOMY  11-22-10    Home Medications:  Allergies as of 08/01/2019   No Known Allergies     Medication List       Accurate as of August 01, 2019  1:45 PM. If you have any questions, ask your nurse or doctor.        allopurinol 300 MG tablet Commonly known as: ZYLOPRIM Take 300 mg by mouth daily.   esomeprazole 20 MG capsule Commonly known as: NEXIUM Take 20 mg by mouth every morning.   finasteride 5 MG tablet Commonly known as: PROSCAR Take 1 tablet (5 mg total) by mouth daily.   levothyroxine 25 MCG tablet Commonly known as: SYNTHROID Take 25 mcg by mouth daily before breakfast.   tamsulosin 0.4 MG Caps capsule Commonly known as: FLOMAX Take 1 capsule (0.4 mg total) by mouth daily. Started by: Zara Council, PA-C       Allergies: No Known Allergies  Family History: No family history on file.  Social History:  reports that he quit smoking about 38 years ago. He has never used smokeless tobacco. He reports current alcohol use of about 2.0 standard drinks of alcohol per week. He reports that he does not use drugs.  ROS: Pertinent ROS in HPI  Physical Exam: BP (!) 149/80   Pulse 80   Ht 5\' 5"  (1.651 m)   Wt (!) 210  lb (95.3 kg)   BMI 34.95 kg/m   Constitutional:  Well nourished. Alert and oriented, No acute distress. HEENT:  AT, mask in place  Trachea midline Cardiovascular: No clubbing, cyanosis, or edema. Respiratory: Normal respiratory effort, no increased work of breathing. Neurologic: Grossly intact, no focal deficits, moving all 4 extremities. Psychiatric: Normal mood and affect.  Laboratory Data: Lab Results  Component Value Date   WBC 10.5 04/03/2017   HGB 16.8 04/03/2017   HCT 49.6 04/03/2017   MCV 90.8 04/03/2017   PLT 212 04/03/2017    Lab Results  Component Value Date   CREATININE 1.08 04/03/2017    Urinalysis Component     Latest Ref Rng & Units 08/01/2019  Color, Urine     YELLOW BROWN (A)    Appearance     CLEAR TURBID (A)  Specific Gravity, Urine     1.005 - 1.030 TEST NOT REPORTED DUE TO COLOR INTERFERENCE OF URINE PIGMENT  pH     5.0 - 8.0 TEST NOT REPORTED DUE TO COLOR INTERFERENCE OF URINE PIGMENT  Glucose, UA     NEGATIVE mg/dL TEST NOT REPORTED DUE TO COLOR INTERFERENCE OF URINE PIGMENT (A)  Hgb urine dipstick     NEGATIVE TEST NOT REPORTED DUE TO COLOR INTERFERENCE OF URINE PIGMENT (A)  Bilirubin Urine     NEGATIVE TEST NOT REPORTED DUE TO COLOR INTERFERENCE OF URINE PIGMENT (A)  Ketones, ur     NEGATIVE mg/dL TEST NOT REPORTED DUE TO COLOR INTERFERENCE OF URINE PIGMENT (A)  Protein     NEGATIVE mg/dL TEST NOT REPORTED DUE TO COLOR INTERFERENCE OF URINE PIGMENT (A)  Nitrite     NEGATIVE TEST NOT REPORTED DUE TO COLOR INTERFERENCE OF URINE PIGMENT (A)  Leukocytes,Ua     NEGATIVE TEST NOT REPORTED DUE TO COLOR INTERFERENCE OF URINE PIGMENT (A)  Squamous Epithelial / LPF     0 - 5 0-5  WBC, UA     0 - 5 WBC/hpf 11-20  RBC / HPF     0 - 5 RBC/hpf >50  Bacteria, UA     NONE SEEN FEW (A)    I have reviewed the labs.   Pertinent Imaging: Results for TALBOT, MONARCH (MRN [id number]) as of 08/01/2019 13:42  Ref. Range 08/01/2019 13:21  Scan Result Unknown 73 ml    Assessment & Plan:    1. Gross hematuria  Recommend a cystoscopy with Dr. Diamantina Providence at this time I have explained to the patient that they will  be scheduled for a cystoscopy in our office to evaluate their bladder.  The cystoscopy consists of passing a tube with a lens up through their urethra and into their urinary bladder.   We will inject the urethra with a lidocaine gel prior to introducing the cystoscope to help with any discomfort during the procedure.   After the procedure, they might experience blood in the urine and discomfort with urination.  This will abate after the first few voids.  I have  encouraged the patient to increase water intake  during this time.  Patient denies any allergies to  lidocaine.  He would like the cysto ASAP as he doesn't want to get in trouble  UA Urine culture  Restart the finasteride Patient will start tamsulosin 0.4 mg and Septra DS as culture is pending  Return for cystoscopy with Dr.Sninsky for gross hematuria .  These notes generated with voice recognition software. I apologize for typographical errors.  Mardene Lessig, PA-C  [place], [place] [address] [phone]

## 2019-08-02 ENCOUNTER — Encounter: Payer: Self-pay | Admitting: Urology

## 2019-08-02 ENCOUNTER — Ambulatory Visit (INDEPENDENT_AMBULATORY_CARE_PROVIDER_SITE_OTHER): Payer: Medicare Other | Admitting: Urology

## 2019-08-02 VITALS — BP 149/80 | HR 80 | Ht 65.0 in | Wt 210.0 lb

## 2019-08-02 DIAGNOSIS — R310 Gross hematuria: Secondary | ICD-10-CM

## 2019-08-02 DIAGNOSIS — N138 Other obstructive and reflux uropathy: Secondary | ICD-10-CM

## 2019-08-02 DIAGNOSIS — N401 Enlarged prostate with lower urinary tract symptoms: Secondary | ICD-10-CM | POA: Diagnosis not present

## 2019-08-02 DIAGNOSIS — N5089 Other specified disorders of the male genital organs: Secondary | ICD-10-CM

## 2019-08-02 MED ORDER — FINASTERIDE 5 MG PO TABS: 5.0000 mg | ORAL_TABLET | Freq: Every day | ORAL | 11 refills | Status: DC

## 2019-08-02 NOTE — Patient Instructions (Addendum)
Hydrocelectomy, Adult  A hydrocelectomy is a surgical procedure to remove a collection of fluid (hydrocele) from the scrotum, which is the pouch that holds the testicles. You may need to have this procedure if a hydrocele is causing painful swelling in your scrotum. Tell a health care provider about:  Any allergies you have.  All medicines you are taking, including vitamins, herbs, eye drops, creams, and over-the-counter medicines.  Any problems you or family members have had with anesthetic medicines.  Any blood disorders you have.  Any surgeries you have had.  Any medical conditions you have. What are the risks? Generally, this is a safe procedure. However, problems may occur, including:  Bleeding into the scrotum (scrotal hematoma).  Damage to nearby structures or organs, including to the testicle or the tube that carries sperm out of the testicle (vas deferens).  Infection.  Allergic reactions to medicines. What happens before the procedure? Staying hydrated Follow instructions from your health care provider about hydration, which may include:  Up to 2 hours before the procedure - you may continue to drink clear liquids, such as water, clear fruit juice, black coffee, and plain tea. Eating and drinking restrictions Follow instructions from your health care provider about eating and drinking, which may include:  8 hours before the procedure - stop eating heavy meals or foods, such as meat, fried foods, or fatty foods.  6 hours before the procedure - stop eating light meals or foods, such as toast or cereal.  6 hours before the procedure - stop drinking milk or drinks that contain milk.  2 hours before the procedure - stop drinking clear liquids. Medicines Ask your health care provider about:  Changing or stopping your regular medicines. This is especially important if you are taking diabetes medicines or blood thinners.  Taking medicines such as aspirin and ibuprofen.  These medicines can thin your blood. Do not take these medicines unless your health care provider tells you to take them.  Taking over-the-counter medicines, vitamins, herbs, and supplements. General instructions  Do not use any products that contain nicotine or tobacco for at least 4 weeks before the procedure. These products include cigarettes, e-cigarettes, and chewing tobacco. If you need help quitting, ask your health care provider.  Plan to have someone take you home from the hospital or clinic.  Plan to have a responsible adult care for you for at least 24 hours after you leave the hospital or clinic. This is important.  Ask your health care provider: ? How your surgery site will be marked. ? What steps will be taken to help prevent infection. These may include:  Removing hair at the surgery site.  Washing skin with a germ-killing soap.  Taking antibiotic medicine. What happens during the procedure?  An IV will be inserted into one of your veins.  You will be given one or more of the following: ? A medicine to make you relax (sedative). ? A medicine to make you fall asleep (general anesthetic).  A small incision will be made through the skin of your scrotum.  Your testicle and the hydrocele will be located, and the hydrocele sac will be opened with an incision.  The fluid will be drained from the hydrocele. Part of the hydrocele sac may be removed.  The hydrocele will be closed with stitches that dissolve (absorbable sutures). This prevents fluid from building up again.  If your hydrocele is large, you may have a thin, rubber drain placed to allow fluid to drain  after the procedure.  The incision in your scrotum will be closed with absorbable sutures, skin glue, or adhesives.  A bandage (dressing) will be placed over the incision. The dressing may be held in place with an athletic support strap (scrotal support). The procedure may vary among health care providers and  hospitals. What happens after the procedure?   Your blood pressure, heart rate, breathing rate, and blood oxygen level will be monitored until you leave the hospital or clinic.  You will be given pain medicine as needed.  Your IV will be removed, and your insertion site will be checked for bleeding.  Do not drive for 24 hours if you were given a sedative during your procedure.  You may need to wear a scrotal support. This holds the dressing in place and supports your scrotum. Summary  A hydrocelectomy is a surgical procedure to remove a collection of fluid (hydrocele) from the scrotum, which is the pouch that holds the testicles. You may need to have this procedure if a hydrocele is causing painful swelling in your scrotum.  During the procedure, the hydrocele will be drained and then closed with stitches that dissolve (absorbable sutures). This prevents fluid from building up again.  If your hydrocele is large, you may have a thin, rubber drain placed to allow fluid to drain after the procedure.  You may need to wear a scrotal support after your procedure. This holds the dressing in place and supports your scrotum. This information is not intended to replace advice given to you by your health care provider. Make sure you discuss any questions you have with your health care provider. Document Revised: 05/18/2018 Document Reviewed: 05/18/2018 Elsevier Patient Education  [employer].   Hydrocele, Adult A hydrocele is a collection of fluid in the loose pouch of skin that holds the testicles (scrotum). This may happen because:  The amount of fluid produced in the scrotum is not absorbed by the rest of the body.  Fluid from the abdomen fills the scrotum. Normally, the testicles develop in the abdomen then move (drop) into to the scrotum before birth. The tube that the testicles travel through usually closes after the testicles drop. If the tube does not close, fluid from the abdomen  can fill the scrotum. This is less common in adults. What are the causes? The cause of a hydrocele in adults is usually not known. However, it may be caused by:  An injury to the scrotum.  An infection (epididymitis).  Decreased blood flow to the scrotum.  Twisting of a testicle (testicular torsion).  A birth defect.  A tumor or cancer of the testicle. What are the signs or symptoms? A hydrocele feels like a water-filled balloon. It may also feel heavy. Other symptoms include:  Swelling of the scrotum. The swelling may decrease when you lie down. You may also notice more swelling at night than in the morning.  Swelling of the groin.  Mild discomfort in the scrotum.  Pain. This can develop if the hydrocele was caused by infection or twisting. The larger the hydrocele, the more likely you are to have pain. How is this diagnosed? This condition may be diagnosed based on:  Physical exam.  Medical history. You may also have other tests, including:  Imaging tests, such as ultrasound.  Blood or urine tests. How is this treated? Most hydroceles go away on their own. If you have no discomfort or pain, your health care provider may suggest close monitoring of your  condition (called watch and wait or watchful waiting) until the condition goes away or symptoms develop. If treatment is needed, it may include:  Treating an underlying condition. This may include using an antibiotic medicine to treat an infection.  Surgery to stop fluid from collecting in the scrotum.  Surgery to drain the fluid. Options include: ? Needle aspiration. A needle is used to drain fluid. However, the fluid buildup will come back quickly. ? Hydrocelectomy. For this procedure, an incision is made in the scrotum to remove the fluid sac. Follow these instructions at home:  Watch the hydrocele for any changes.  Take over-the-counter and prescription medicines only as told by your health care provider.  If  you were prescribed an antibiotic medicine, use it as told by your health care provider. Do not stop taking the antibiotic even if you start to feel better.  Keep all follow-up visits as told by your health care provider. This is important. Contact a health care provider if:  You notice any changes in the hydrocele.  The swelling in your scrotum or groin gets worse.  The hydrocele becomes red, firm, painful, or tender to the touch.  You have a fever. Get help right away if you:  Develop a lot of pain, or your pain becomes worse. Summary  A hydrocele is a collection of fluid in the loose pouch of skin that holds the testicles (scrotum).  Hydroceles can cause swelling, discomfort, and sometimes pain.  In adults, the cause of a hydrocele usually is not known. However, it is sometimes caused by an infection or a rotation and twisting of the scrotum.  Treatment is usually not needed. Hydroceles often go away on their own. If a hydrocele causes pain, treatment may be given to ease the pain. This information is not intended to replace advice given to you by your health care provider. Make sure you discuss any questions you have with your health care provider. Document Revised: 01/03/2017 Document Reviewed: 01/03/2017 Elsevier Patient Education  2020 [employer].

## 2019-08-02 NOTE — Progress Notes (Signed)
   08/02/2019 11:06 AM   Treg K Wirkkala [birth year] [id number]  Reason for visit: Follow up gross hematuria, left scrotal swelling  HPI: I saw Mr. Dicenzo in urology clinic today for follow-up of the above issues.  He is an 79 year old male who I originally met in December 2019 after he had undergone a hernia procedure with Dr. Windell Moment and had developed gross hematuria.  Catheter was removed postop and he was able to void spontaneously and discharged home.  He follow-up with me in January 2020 and I recommended starting finasteride.  His urologic history is notable for a TURP in 2012 with Dr. Jeffie Pollock, as well as gross hematuria work-up with him in 2019.  CT was reportedly benign, and cystoscopy was notable for some prostatic regrowth with friable tissue that was the suspected source of his gross hematuria.  He never started the finasteride, and was lost to follow-up.  He came in the clinic yesterday and saw our PA Zara Council reporting a few days of painless gross hematuria.  Urinalysis was notable for greater than 50 RBCs, 11-20 WBCs, few bacteria.  He was started on Bactrim and Flomax, and she recommended he also start the finasteride as previously prescribed.  He reports his hematuria has resolved today after starting antibiotics and he has no urinary complaints.  We discussed possible etiologies of his gross hematuria including possible UTI versus BPH and friable prostatic tissue.  I recommended following up his urine culture prior to completing a gross hematuria work-up with a repeat CT or cystoscopy in the setting of his equivocal urinalysis, and improvement on antibiotics.  He is also very bothered by moderate left scrotal swelling that he has had since his hernia surgery.  This swelling increases with physical activity, and he is interested in treatment options.  On exam, there is moderate left scrotal edema, but no testicular masses noted.  I recommended a scrotal ultrasound for further  evaluation, and consideration of hydrocelectomy in the future if confirmed on ultrasound.  Would also recommend cystoscopy and possible cystoscopy/HOLEP/TURP at that time for his recurrent gross hematuria.  -Scrotal ultrasound -Continue Bactrim, Flomax, and finasteride -Follow-up urine culture, call with results -Follow-up in 3 to 4 weeks for re-evaluation of gross hematuria, discuss scrotal ultrasound results  Billey Co, MD  [facility] [facility] [address] [place], [place] [address] [phone]

## 2019-08-03 LAB — URINE CULTURE: Culture: <10000 — AB

## 2019-08-15 ENCOUNTER — Telehealth: Payer: Self-pay

## 2019-08-15 NOTE — Telephone Encounter (Signed)
Patient called wanting to clarify how long he was to continue on flomax and finesteride. It was explained he was to continue on these until further notice to help with his urinary symptoms. Patient will keep follow up as scheduled

## 2019-08-18 ENCOUNTER — Ambulatory Visit
Admission: RE | Admit: 2019-08-18 | Discharge: 2019-08-18 | Disposition: A | Discharge status: Home / Self Care | Payer: Medicare Other | Source: Ambulatory Visit | Attending: Urology | Admitting: Urology

## 2019-08-18 ENCOUNTER — Other Ambulatory Visit: Payer: Self-pay

## 2019-08-18 DIAGNOSIS — N5089 Other specified disorders of the male genital organs: Secondary | ICD-10-CM

## 2019-08-24 ENCOUNTER — Ambulatory Visit: Payer: Medicare Other | Admitting: Urology

## 2019-08-24 ENCOUNTER — Other Ambulatory Visit: Payer: Self-pay

## 2019-08-24 ENCOUNTER — Encounter: Payer: Self-pay | Admitting: Urology

## 2019-08-24 VITALS — BP 138/83 | HR 89 | Ht 65.0 in | Wt 210.0 lb

## 2019-08-24 DIAGNOSIS — N432 Other hydrocele: Secondary | ICD-10-CM

## 2019-08-24 DIAGNOSIS — N401 Enlarged prostate with lower urinary tract symptoms: Secondary | ICD-10-CM | POA: Diagnosis not present

## 2019-08-24 DIAGNOSIS — N138 Other obstructive and reflux uropathy: Secondary | ICD-10-CM | POA: Diagnosis not present

## 2019-08-24 DIAGNOSIS — R310 Gross hematuria: Secondary | ICD-10-CM

## 2019-08-24 LAB — MICROSCOPIC EXAMINATION
Bacteria, UA: NONE SEEN
RBC, Urine: NONE SEEN /hpf (ref 0–2)

## 2019-08-24 LAB — URINALYSIS, COMPLETE
Bilirubin, UA: NEGATIVE
Glucose, UA: NEGATIVE
Ketones, UA: NEGATIVE
Leukocytes,UA: NEGATIVE
Nitrite, UA: NEGATIVE
Protein,UA: NEGATIVE
RBC, UA: NEGATIVE
Specific Gravity, UA: 1.02 (ref 1.005–1.030)
Urobilinogen, Ur: 0.2 mg/dL (ref 0.2–1.0)
pH, UA: 5.5 (ref 5.0–7.5)

## 2019-08-24 LAB — BLADDER SCAN AMB NON-IMAGING

## 2019-08-24 NOTE — Progress Notes (Signed)
   08/24/2019 5:24 PM   Tyler Gordon November 09, 1940 [id number]  Reason for visit: Follow up gross hematuria, scrotal swelling  HPI: I saw Tyler Gordon in urology clinic for follow-up of the above issues. Regarding his gross hematuria, he has had a long history of gross hematuria, and had a TURP in 2012 with Dr. Jeffie Pollock. He had a negative hematuria work-up in 2019, as well as a repeat episode of gross hematuria with a catheter placement in December 2019 with general surgery. He recently was seen by our PA in July after presenting with recurrent asymptomatic gross hematuria, and urine culture was ultimately negative. We discussed options at length for further evaluation of his gross hematuria, and he is amenable to a CT urogram. 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 Ralene Muskrat cystoscopy findings in 2019. I also recommended completing a cystoscopy. He denies any significant urinary symptoms and reports he is voiding with a strong stream. PVR is normal today at 50 mL.  Regarding his scrotal swelling, this has been persistent on the left side since undergoing that hernia surgery in December 2019 and is increasingly bothersome. At our last visit I ordered a scrotal ultrasound which confirmed a large left hydrocele. He is interested in pursuing surgical treatment for this. We discussed different options including observation, clinic aspiration, or definitive management with hydrocelectomy, and the risks and benefits were discussed at length. He would like to pursue a hydrocelectomy. I recommended completing the cystoscopy for hematuria work-up at the time of hydrocelectomy so that he does not have to undergo this awake in clinic. He is in agreement with this plan.  Schedule left hydrocelectomy, cystoscopy at same time to complete hematuria work-up. CT urogram prior to surgery for gross hematuria work-up, and will call with those results  Billey Co,  [facility] [address], [place], [place] [address] [phone]

## 2019-08-24 NOTE — Patient Instructions (Signed)
Hydrocelectomy, Adult  A hydrocelectomy is a surgical procedure to remove a collection of fluid (hydrocele) from the scrotum, which is the pouch that holds the testicles. You may need to have this procedure if a hydrocele is causing painful swelling in your scrotum. Tell a health care provider about:  Any allergies you have.  All medicines you are taking, including vitamins, herbs, eye drops, creams, and over-the-counter medicines.  Any problems you or family members have had with anesthetic medicines.  Any blood disorders you have.  Any surgeries you have had.  Any medical conditions you have. What are the risks? Generally, this is a safe procedure. However, problems may occur, including:  Bleeding into the scrotum (scrotal hematoma).  Damage to nearby structures or organs, including to the testicle or the tube that carries sperm out of the testicle (vas deferens).  Infection.  Allergic reactions to medicines. What happens before the procedure? Staying hydrated Follow instructions from your health care provider about hydration, which may include:  Up to 2 hours before the procedure - you may continue to drink clear liquids, such as water, clear fruit juice, black coffee, and plain tea. Eating and drinking restrictions Follow instructions from your health care provider about eating and drinking, which may include:  8 hours before the procedure - stop eating heavy meals or foods, such as meat, fried foods, or fatty foods.  6 hours before the procedure - stop eating light meals or foods, such as toast or cereal.  6 hours before the procedure - stop drinking milk or drinks that contain milk.  2 hours before the procedure - stop drinking clear liquids. Medicines Ask your health care provider about:  Changing or stopping your regular medicines. This is especially important if you are taking diabetes medicines or blood thinners.  Taking medicines such as aspirin and ibuprofen.  These medicines can thin your blood. Do not take these medicines unless your health care provider tells you to take them.  Taking over-the-counter medicines, vitamins, herbs, and supplements. General instructions  Do not use any products that contain nicotine or tobacco for at least 4 weeks before the procedure. These products include cigarettes, e-cigarettes, and chewing tobacco. If you need help quitting, ask your health care provider.  Plan to have someone take you home from the hospital or clinic.  Plan to have a responsible adult care for you for at least 24 hours after you leave the hospital or clinic. This is important.  Ask your health care provider: ? How your surgery site will be marked. ? What steps will be taken to help prevent infection. These may include:  Removing hair at the surgery site.  Washing skin with a germ-killing soap.  Taking antibiotic medicine. What happens during the procedure?  An IV will be inserted into one of your veins.  You will be given one or more of the following: ? A medicine to make you relax (sedative). ? A medicine to make you fall asleep (general anesthetic).  A small incision will be made through the skin of your scrotum.  Your testicle and the hydrocele will be located, and the hydrocele sac will be opened with an incision.  The fluid will be drained from the hydrocele. Part of the hydrocele sac may be removed.  The hydrocele will be closed with stitches that dissolve (absorbable sutures). This prevents fluid from building up again.  If your hydrocele is large, you may have a thin, rubber drain placed to allow fluid to drain  after the procedure.  The incision in your scrotum will be closed with absorbable sutures, skin glue, or adhesives.  A bandage (dressing) will be placed over the incision. The dressing may be held in place with an athletic support strap (scrotal support). The procedure may vary among health care providers and  hospitals. What happens after the procedure?   Your blood pressure, heart rate, breathing rate, and blood oxygen level will be monitored until you leave the hospital or clinic.  You will be given pain medicine as needed.  Your IV will be removed, and your insertion site will be checked for bleeding.  Do not drive for 24 hours if you were given a sedative during your procedure.  You may need to wear a scrotal support. This holds the dressing in place and supports your scrotum. Summary  A hydrocelectomy is a surgical procedure to remove a collection of fluid (hydrocele) from the scrotum, which is the pouch that holds the testicles. You may need to have this procedure if a hydrocele is causing painful swelling in your scrotum.  During the procedure, the hydrocele will be drained and then closed with stitches that dissolve (absorbable sutures). This prevents fluid from building up again.  If your hydrocele is large, you may have a thin, rubber drain placed to allow fluid to drain after the procedure.  You may need to wear a scrotal support after your procedure. This holds the dressing in place and supports your scrotum. This information is not intended to replace advice given to you by your health care provider. Make sure you discuss any questions you have with your health care provider. Document Revised: 05/18/2018 Document Reviewed: 05/18/2018 Elsevier Patient Education  [employer].   Current Urology, 10(1), 1-14. https://doi.[email or web address]/[email or web address]">  Hydrocelectomy, Adult, Care After This sheet gives you information about how to care for yourself after your procedure. Your health care provider may also give you more specific instructions. If you have problems or questions, contact your health care provider. What can I expect after the procedure? After your procedure, it is common to have:  Mild discomfort and swelling in the pouch that holds your testicles  (scrotum).  Bruising of the scrotum. Follow these instructions at home: Medicines  Take over-the-counter and prescription medicines only as told by your health care provider.  Ask your health care provider if the medicine prescribed to you: ? Requires you to avoid driving or using heavy machinery. ? Can cause constipation. You may need to take these actions to prevent or treat constipation:  Drink enough fluid to keep your urine pale yellow.  Take over-the-counter or prescription medicines.  Eat foods that are high in fiber, such as beans, whole grains, and fresh fruits and vegetables.  Limit foods that are high in fat and processed sugars, such as fried or sweet foods. Bathing  Do not take baths, swim, or use a hot tub until your health care provider approves. Ask your health care provider if you may take showers. You may only be allowed to take sponge baths.  If you were told to wear an athletic support strap (scrotal support), keep it dry. Take it off when you shower or bathe. Incision care   Follow instructions from your health care provider about how to take care of your incision. Make sure you: ? Wash your hands with soap and water before and after you change your bandage (dressing). If soap and water are not available, use hand sanitizer. ? Change your dressing as  told by your health care provider. ? Leave stitches (sutures), skin glue, or adhesive strips in place. These skin closures may need to stay in place for 2 weeks or longer. If adhesive strip edges start to loosen and curl up, you may trim the loose edges. Do not remove adhesive strips completely unless your health care provider tells you to do that.  Check your incision and scrotum every day for signs of infection. Check for: ? More redness, swelling, or pain. ? Fluid or blood. ? Warmth. ? Pus or a bad smell. Managing pain and swelling If directed, put ice on the affected area. To do this:  Put ice in a plastic  bag.  Place a towel between your skin and the bag.  Leave the ice on for 20 minutes, 2-3 times per day.  Activity  Do not do any high-energy activities for as long as told by your health care provider.  Do not lift anything that is heavier than 10 lb (4.5 kg), or the limit that you are told, until your health care provider says that it is safe.  Return to your normal activities as told by your health care provider. Ask your health care provider what activities are safe for you.  Do not drive for 24 hours if you were given a sedative during your procedure.  Ask your health care provider when it is safe to drive. General instructions  Do not use any products that contain nicotine or tobacco, such as cigarettes, e-cigarettes, and chewing tobacco. These can delay incision healing after surgery. If you need help quitting, ask your health care provider.  If you were given a scrotal support, wear it as told by your health care provider.  If you had a drain put in during the procedure, you will need to have it removed at a follow-up visit.  Keep all follow-up visits as told by your health care provider. This is important. Contact a health care provider if:  Your pain is not controlled with medicine.  You have more redness, swelling, or pain around your scrotum.  You have fluid or blood coming from your incision.  Your incision feels warm to the touch.  You have pus or a bad smell coming from your scrotum.  You have a fever. Get help right away if:  You develop shaking, chills, and a fever that is higher than 101.49F (38.8C).  You have redness or swelling that starts at your scrotum and spreads outward to cover your whole groin.  You develop swelling of the legs or difficulty breathing. Summary  After a hydrocelectomy, it is common to have mild discomfort, swelling, and bruising.  Do not take baths, swim, or use a hot tub until your health care provider approves. Ask your  health care provider if you may take showers.  If directed, put ice on the affected area to help with pain and swelling.  Do not do any high-energy activities or lift anything heavier than 10 lb (4.5 kg) for as long as told by your health care provider.  If you were given a scrotal support, keep it dry. Wear the scrotal support as told by your health care provider. This information is not intended to replace advice given to you by your health care provider. Make sure you discuss any questions you have with your health care provider. Document Revised: 05/18/2018 Document Reviewed: 05/18/2018 Elsevier Patient Education  [place].

## 2019-08-24 NOTE — H&P (View-Only) (Signed)
   08/24/2019 5:24 PM   Tyler Gordon 12-12-1940 [id number]  Reason for visit: Follow up gross hematuria, scrotal swelling  HPI: I saw [name] in urology clinic for follow-up of the above issues. Regarding his gross hematuria, he has had a long history of gross hematuria, and had a TURP in 2012 with Dr. Jeffie Pollock. He had a negative hematuria work-up in 2019, as well as a repeat episode of gross hematuria with a catheter placement in December 2019 with general surgery. He recently was seen by our PA in July after presenting with recurrent asymptomatic gross hematuria, and urine culture was ultimately negative. We discussed options at length for further evaluation of his gross hematuria, and he is amenable to a CT urogram. 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 Ralene Muskrat cystoscopy findings in 2019. I also recommended completing a cystoscopy. He denies any significant urinary symptoms and reports he is voiding with a strong stream. PVR is normal today at 50 mL.  Regarding his scrotal swelling, this has been persistent on the left side since undergoing that hernia surgery in December 2019 and is increasingly bothersome. At our last visit I ordered a scrotal ultrasound which confirmed a large left hydrocele. He is interested in pursuing surgical treatment for this. We discussed different options including observation, clinic aspiration, or definitive management with hydrocelectomy, and the risks and benefits were discussed at length. He would like to pursue a hydrocelectomy. I recommended completing the cystoscopy for hematuria work-up at the time of hydrocelectomy so that he does not have to undergo this awake in clinic. He is in agreement with this plan.  Schedule left hydrocelectomy, cystoscopy at same time to complete hematuria work-up. CT urogram prior to surgery for gross hematuria work-up, and will call with those results  Billey Co,  [facility] [address], [place], [place] [address] [phone]

## 2019-08-29 ENCOUNTER — Telehealth: Payer: Self-pay

## 2019-08-29 NOTE — Telephone Encounter (Signed)
Patient called stating that he has been vomiting all night and believes the Tamsulosin is causing this. He states that "the bottom of his bladder, at the base feels different" since starting the medication. It was explained that since he has been on the medication for over a month it would be unlikely that he would develop nausea as a side effect. He states he is also "very warm" he has no thermometer to check his temperature. He was encouraged to get one for monitoring and to follow up with PCP in regards to these symptoms. He was told he may stop the Tamsulosin and see if his symptoms resolve.

## 2019-08-30 ENCOUNTER — Other Ambulatory Visit: Payer: Self-pay | Admitting: Urology

## 2019-08-30 DIAGNOSIS — R310 Gross hematuria: Secondary | ICD-10-CM

## 2019-08-30 DIAGNOSIS — N432 Other hydrocele: Secondary | ICD-10-CM

## 2019-08-31 LAB — CULTURE, URINE COMPREHENSIVE

## 2019-09-01 ENCOUNTER — Encounter
Admission: RE | Admit: 2019-09-01 | Discharge: 2019-09-01 | Disposition: A | Discharge status: Home / Self Care | Payer: Medicare Other | Source: Ambulatory Visit | Attending: Urology | Admitting: Urology

## 2019-09-01 ENCOUNTER — Other Ambulatory Visit: Payer: Self-pay

## 2019-09-01 HISTORY — DX: Hypothyroidism, unspecified: E03.9

## 2019-09-01 NOTE — Patient Instructions (Addendum)
Your procedure is scheduled on: 09-09-19 FRIDAY Report to Same Day Surgery 2nd floor medical mall [facility] Entrance-take elevator on left to 2nd floor.  Check in with surgery information desk.) To find out your arrival time please call [phone] between 1PM - 3PM on 09-08-19 THURSDAY  Remember: Instructions that are not followed completely may result in serious medical risk, up to and including death, or upon the discretion of your surgeon and anesthesiologist your surgery may need to be rescheduled.    _x___ 1. Do not eat food after midnight the night before your procedure. NO GUM OR CANDY AFTER MIDNIGHT. You may drink clear liquids up to 2 hours before you are scheduled to arrive at the hospital for your procedure.  Do not drink clear liquids within 2 hours of your scheduled arrival to the hospital.  Clear liquids include  --Water or Apple juice without pulp  --Gatorade  --Black Coffee or Clear Tea (No milk, no creamers, do not add anything to the coffee or Tea-OK TO ADD SUGAR)   __x__ 2. No Alcohol for 24 hours before or after surgery.   __x__3. No Smoking or e-cigarettes for 24 prior to surgery.  Do not use any chewable tobacco products for at least 6 hour prior to surgery   ____  4. Bring all medications with you on the day of surgery if instructed.    __x__ 5. Notify your doctor if there is any change in your medical condition     (cold, fever, infections).    x___6. On the morning of surgery brush your teeth with toothpaste and water.  You may rinse your mouth with mouth wash if you wish.  Do not swallow any toothpaste or mouthwash.   Do not wear jewelry, make-up, hairpins, clips or nail polish.  Do not wear lotions, powders, or perfumes. You may wear deodorant.  Do not shave 48 hours prior to surgery. Men may shave face and neck.  Do not bring valuables to the hospital.    [facility] is not responsible for any belongings or valuables.               Contacts, dentures or  bridgework may not be worn into surgery.  Leave your suitcase in the car. After surgery it may be brought to your room.  For patients admitted to the hospital, discharge time is determined by your treatment team.  _  Patients discharged the day of surgery will not be allowed to drive home.  You will need someone to drive you home and stay with you the night of your procedure.    Please read over the following fact sheets that you were given:   [facility] Preparing for Surgery   _x___ TAKE THE FOLLOWING MEDICATION THE MORNING OF SURGERY WITH A SMALL SIP OF WATER. These include:  1. ALLOPURINOL (ZYLOPRIM)  2. SYNTHROID (LEVOTHYROXINE)  3. NEXIUM (ESOMEPRAZOLE)  4. TAKE AN EXTRA NEXIUM THE NIGHT BEFORE SURGERY  5.  6.  ____Fleets enema or Magnesium Citrate as directed.   ____ Use CHG Soap or sage wipes as directed on instruction sheet   ____ Use inhalers on the day of surgery and bring to hospital day of surgery  ____ Stop Metformin and Janumet 2 days prior to surgery.    ____ Take 1/2 of usual insulin dose the night before surgery and none on the morning surgery.   ____ Follow recommendations from Cardiologist, Pulmonologist or PCP regarding  stopping Aspirin, Coumadin, Plavix ,Eliquis, Effient,  or Pradaxa, and Pletal.  X____Stop Anti-inflammatories such as Advil, Aleve, Ibuprofen, Motrin, Naproxen, Naprosyn, Goodies powders or aspirin products NOW-OK to take Tylenol    ____ Stop supplements until after surgery.     ____ Bring C-Pap to the hospital.

## 2019-09-02 ENCOUNTER — Encounter
Admission: RE | Admit: 2019-09-02 | Discharge: 2019-09-02 | Disposition: A | Discharge status: Home / Self Care | Payer: Medicare Other | Source: Ambulatory Visit | Attending: Urology | Admitting: Urology

## 2019-09-02 DIAGNOSIS — Z01818 Encounter for other preprocedural examination: Secondary | ICD-10-CM | POA: Insufficient documentation

## 2019-09-07 ENCOUNTER — Other Ambulatory Visit: Payer: Self-pay

## 2019-09-07 ENCOUNTER — Other Ambulatory Visit
Admission: RE | Admit: 2019-09-07 | Discharge: 2019-09-07 | Disposition: A | Discharge status: Home / Self Care | Payer: Medicare Other | Source: Ambulatory Visit | Attending: Urology | Admitting: Urology

## 2019-09-07 ENCOUNTER — Ambulatory Visit
Admission: RE | Admit: 2019-09-07 | Discharge: 2019-09-07 | Disposition: A | Discharge status: Home / Self Care | Payer: Medicare Other | Source: Ambulatory Visit | Attending: Urology | Admitting: Urology

## 2019-09-07 DIAGNOSIS — Z01812 Encounter for preprocedural laboratory examination: Secondary | ICD-10-CM | POA: Insufficient documentation

## 2019-09-07 DIAGNOSIS — R310 Gross hematuria: Secondary | ICD-10-CM | POA: Insufficient documentation

## 2019-09-07 DIAGNOSIS — Z20822 Contact with and (suspected) exposure to covid-19: Secondary | ICD-10-CM | POA: Insufficient documentation

## 2019-09-07 DIAGNOSIS — N401 Enlarged prostate with lower urinary tract symptoms: Secondary | ICD-10-CM

## 2019-09-07 LAB — SARS CORONAVIRUS 2 (TAT 6-24 HRS): SARS Coronavirus 2: NEGATIVE

## 2019-09-07 LAB — POCT I-STAT CREATININE: Creatinine, Ser: 1.1 mg/dL (ref 0.61–1.24)

## 2019-09-07 MED ORDER — IOHEXOL 300 MG/ML  SOLN
150.0000 mL | Freq: Once | INTRAMUSCULAR | Status: AC | PRN
  Administered 2019-09-07: 125 mL via INTRAVENOUS

## 2019-09-07 MED ORDER — TAMSULOSIN HCL 0.4 MG PO CAPS: 0.4000 mg | ORAL_CAPSULE | Freq: Every evening | ORAL | 3 refills | Status: DC

## 2019-09-07 MED ORDER — FINASTERIDE 5 MG PO TABS: 5.0000 mg | ORAL_TABLET | Freq: Every evening | ORAL | 3 refills | Status: DC

## 2019-09-07 NOTE — Telephone Encounter (Signed)
Incoming rx request from pharmacy, rx filled.

## 2019-09-09 ENCOUNTER — Other Ambulatory Visit: Payer: Self-pay

## 2019-09-09 ENCOUNTER — Ambulatory Visit
Admission: RE | Admit: 2019-09-09 | Discharge: 2019-09-09 | Disposition: A | Discharge status: Home / Self Care | Payer: Medicare Other | Attending: Urology | Admitting: Urology

## 2019-09-09 ENCOUNTER — Encounter
Admission: RE | Disposition: A | Discharge status: Home / Self Care | Payer: Self-pay | Source: Home / Self Care | Attending: Urology

## 2019-09-09 ENCOUNTER — Ambulatory Visit: Payer: Medicare Other | Admitting: Anesthesiology

## 2019-09-09 ENCOUNTER — Encounter: Payer: Self-pay | Admitting: Urology

## 2019-09-09 DIAGNOSIS — M109 Gout, unspecified: Secondary | ICD-10-CM | POA: Diagnosis not present

## 2019-09-09 DIAGNOSIS — R310 Gross hematuria: Secondary | ICD-10-CM | POA: Diagnosis not present

## 2019-09-09 DIAGNOSIS — E039 Hypothyroidism, unspecified: Secondary | ICD-10-CM | POA: Diagnosis not present

## 2019-09-09 DIAGNOSIS — N432 Other hydrocele: Secondary | ICD-10-CM

## 2019-09-09 DIAGNOSIS — K219 Gastro-esophageal reflux disease without esophagitis: Secondary | ICD-10-CM | POA: Insufficient documentation

## 2019-09-09 DIAGNOSIS — E669 Obesity, unspecified: Secondary | ICD-10-CM | POA: Insufficient documentation

## 2019-09-09 DIAGNOSIS — E781 Pure hyperglyceridemia: Secondary | ICD-10-CM | POA: Diagnosis not present

## 2019-09-09 DIAGNOSIS — Z87891 Personal history of nicotine dependence: Secondary | ICD-10-CM | POA: Insufficient documentation

## 2019-09-09 DIAGNOSIS — Z79890 Hormone replacement therapy (postmenopausal): Secondary | ICD-10-CM | POA: Diagnosis not present

## 2019-09-09 DIAGNOSIS — N401 Enlarged prostate with lower urinary tract symptoms: Secondary | ICD-10-CM | POA: Insufficient documentation

## 2019-09-09 DIAGNOSIS — M1990 Unspecified osteoarthritis, unspecified site: Secondary | ICD-10-CM | POA: Insufficient documentation

## 2019-09-09 DIAGNOSIS — N433 Hydrocele, unspecified: Secondary | ICD-10-CM | POA: Insufficient documentation

## 2019-09-09 DIAGNOSIS — Z79899 Other long term (current) drug therapy: Secondary | ICD-10-CM | POA: Insufficient documentation

## 2019-09-09 DIAGNOSIS — Z6834 Body mass index (BMI) 34.0-34.9, adult: Secondary | ICD-10-CM | POA: Diagnosis not present

## 2019-09-09 HISTORY — PX: HYDROCELE EXCISION: SHX482

## 2019-09-09 HISTORY — PX: CYSTOSCOPY: SHX5120

## 2019-09-09 SURGERY — CYSTOSCOPY
Anesthesia: General | Site: Scrotum

## 2019-09-09 MED ORDER — FENTANYL CITRATE (PF) 100 MCG/2ML IJ SOLN
INTRAMUSCULAR | Status: AC
  Administered 2019-09-09: 25 ug via INTRAVENOUS
  Filled 2019-09-09: qty 2

## 2019-09-09 MED ORDER — BACITRACIN ZINC 500 UNIT/GM EX OINT
TOPICAL_OINTMENT | CUTANEOUS | Status: AC
  Filled 2019-09-09: qty 28.35

## 2019-09-09 MED ORDER — ACETAMINOPHEN 10 MG/ML IV SOLN
INTRAVENOUS | Status: AC
  Filled 2019-09-09: qty 100

## 2019-09-09 MED ORDER — DEXAMETHASONE SODIUM PHOSPHATE 10 MG/ML IJ SOLN
INTRAMUSCULAR | Status: AC
  Filled 2019-09-09: qty 1

## 2019-09-09 MED ORDER — LIDOCAINE HCL (PF) 1 % IJ SOLN
INTRAMUSCULAR | Status: AC
  Filled 2019-09-09: qty 30

## 2019-09-09 MED ORDER — CEFAZOLIN SODIUM-DEXTROSE 2-4 GM/100ML-% IV SOLN
INTRAVENOUS | Status: AC
  Filled 2019-09-09: qty 100

## 2019-09-09 MED ORDER — GLYCOPYRROLATE 0.2 MG/ML IJ SOLN
INTRAMUSCULAR | Status: DC | PRN
  Administered 2019-09-09: .2 mg via INTRAVENOUS

## 2019-09-09 MED ORDER — ACETAMINOPHEN 10 MG/ML IV SOLN
INTRAVENOUS | Status: DC | PRN
  Administered 2019-09-09: 1000 mg via INTRAVENOUS

## 2019-09-09 MED ORDER — FENTANYL CITRATE (PF) 100 MCG/2ML IJ SOLN
INTRAMUSCULAR | Status: DC | PRN
Start: 2019-09-09 — End: 2019-09-09
  Administered 2019-09-09 (×2): 50 ug via INTRAVENOUS

## 2019-09-09 MED ORDER — SUCCINYLCHOLINE CHLORIDE 20 MG/ML IJ SOLN
INTRAMUSCULAR | Status: DC | PRN
  Administered 2019-09-09: 40 mg via INTRAVENOUS

## 2019-09-09 MED ORDER — ONDANSETRON HCL 4 MG/2ML IJ SOLN
INTRAMUSCULAR | Status: AC
  Filled 2019-09-09: qty 2

## 2019-09-09 MED ORDER — PROPOFOL 10 MG/ML IV BOLUS
INTRAVENOUS | Status: DC | PRN
  Administered 2019-09-09: 150 mg via INTRAVENOUS

## 2019-09-09 MED ORDER — LACTATED RINGERS IV SOLN: INTRAVENOUS | Status: DC

## 2019-09-09 MED ORDER — ORAL CARE MOUTH RINSE: 15.0000 mL | Freq: Once | OROMUCOSAL | Status: AC

## 2019-09-09 MED ORDER — LIDOCAINE HCL 1 % IJ SOLN
INTRAMUSCULAR | Status: DC | PRN
  Administered 2019-09-09: 8 mL

## 2019-09-09 MED ORDER — DEXAMETHASONE SODIUM PHOSPHATE 10 MG/ML IJ SOLN
INTRAMUSCULAR | Status: DC | PRN
  Administered 2019-09-09: 10 mg via INTRAVENOUS

## 2019-09-09 MED ORDER — BACITRACIN 500 UNIT/GM EX OINT
TOPICAL_OINTMENT | CUTANEOUS | Status: DC | PRN
  Administered 2019-09-09: 1 via TOPICAL

## 2019-09-09 MED ORDER — CHLORHEXIDINE GLUCONATE 0.12 % MT SOLN: 15.0000 mL | Freq: Once | OROMUCOSAL | Status: AC

## 2019-09-09 MED ORDER — LIDOCAINE HCL (CARDIAC) PF 100 MG/5ML IV SOSY
PREFILLED_SYRINGE | INTRAVENOUS | Status: DC | PRN
  Administered 2019-09-09: 100 mg via INTRAVENOUS

## 2019-09-09 MED ORDER — ONDANSETRON HCL 4 MG/2ML IJ SOLN: 4.0000 mg | Freq: Once | INTRAMUSCULAR | Status: DC | PRN

## 2019-09-09 MED ORDER — SUGAMMADEX SODIUM 200 MG/2ML IV SOLN
INTRAVENOUS | Status: DC | PRN
  Administered 2019-09-09: 190 mg via INTRAVENOUS

## 2019-09-09 MED ORDER — FENTANYL CITRATE (PF) 100 MCG/2ML IJ SOLN
INTRAMUSCULAR | Status: AC
  Filled 2019-09-09: qty 2

## 2019-09-09 MED ORDER — IPRATROPIUM-ALBUTEROL 0.5-2.5 (3) MG/3ML IN SOLN
RESPIRATORY_TRACT | Status: AC
  Administered 2019-09-09: 3 mL via RESPIRATORY_TRACT
  Filled 2019-09-09: qty 3

## 2019-09-09 MED ORDER — ROCURONIUM BROMIDE 100 MG/10ML IV SOLN
INTRAVENOUS | Status: DC | PRN
  Administered 2019-09-09: 10 mg via INTRAVENOUS
  Administered 2019-09-09: 20 mg via INTRAVENOUS
  Administered 2019-09-09: 50 mg via INTRAVENOUS

## 2019-09-09 MED ORDER — HYDROCODONE-ACETAMINOPHEN 5-325 MG PO TABS
1.0000 | ORAL_TABLET | ORAL | 0 refills | Status: AC | PRN
Start: 2019-09-09 — End: 2019-09-12

## 2019-09-09 MED ORDER — ONDANSETRON HCL 4 MG/2ML IJ SOLN
INTRAMUSCULAR | Status: DC | PRN
  Administered 2019-09-09: 4 mg via INTRAVENOUS

## 2019-09-09 MED ORDER — CHLORHEXIDINE GLUCONATE 0.12 % MT SOLN
OROMUCOSAL | Status: AC
  Administered 2019-09-09: 15 mL via OROMUCOSAL
  Filled 2019-09-09: qty 15

## 2019-09-09 MED ORDER — CHLORHEXIDINE GLUCONATE 0.12 % MT SOLN
OROMUCOSAL | Status: AC
  Filled 2019-09-09: qty 15

## 2019-09-09 MED ORDER — FENTANYL CITRATE (PF) 100 MCG/2ML IJ SOLN
25.0000 ug | INTRAMUSCULAR | Status: DC | PRN
  Administered 2019-09-09 (×4): 25 ug via INTRAVENOUS

## 2019-09-09 MED ORDER — CEFAZOLIN SODIUM-DEXTROSE 2-4 GM/100ML-% IV SOLN
2.0000 g | INTRAVENOUS | Status: AC
  Administered 2019-09-09: 2 g via INTRAVENOUS

## 2019-09-09 MED ORDER — IPRATROPIUM-ALBUTEROL 0.5-2.5 (3) MG/3ML IN SOLN: 3.0000 mL | Freq: Once | RESPIRATORY_TRACT | Status: AC

## 2019-09-09 SURGICAL SUPPLY — 44 items
APL PRP STRL LF DISP 70% ISPRP (MISCELLANEOUS) ×2
BAG DRAIN CYSTO-URO LG1000N (MISCELLANEOUS) ×4 IMPLANT
BLADE CLIPPER SURG (BLADE) ×4 IMPLANT
BLADE SURG 15 STRL LF DISP TIS (BLADE) ×2 IMPLANT
BLADE SURG 15 STRL SS (BLADE) ×4
BRIEF STRETCH MATERNITY 2XLG (MISCELLANEOUS) ×4 IMPLANT
BRUSH SCRUB EZ 1% IODOPHOR (MISCELLANEOUS) ×4 IMPLANT
CANISTER SUCT 1200ML W/VALVE (MISCELLANEOUS) ×4 IMPLANT
CATH URETL 5X70 OPEN END (CATHETERS) ×4 IMPLANT
CHLORAPREP W/TINT 26 (MISCELLANEOUS) ×4 IMPLANT
COVER WAND RF STERILE (DRAPES) ×4 IMPLANT
DRAIN PENROSE 1/4X12 LTX STRL (WOUND CARE) IMPLANT
DRAPE LAPAROTOMY 77X122 PED (DRAPES) ×4 IMPLANT
DRSG GAUZE FLUFF 36X18 (GAUZE/BANDAGES/DRESSINGS) ×4 IMPLANT
DRSG TELFA 4X3 1S NADH ST (GAUZE/BANDAGES/DRESSINGS) ×4 IMPLANT
ELECT REM PT RETURN 9FT ADLT (ELECTROSURGICAL) ×4
ELECTRODE REM PT RTRN 9FT ADLT (ELECTROSURGICAL) ×2 IMPLANT
GAUZE SPONGE 4X4 12PLY STRL (GAUZE/BANDAGES/DRESSINGS) IMPLANT
GLOVE BIOGEL PI IND STRL 7.5 (GLOVE) ×2 IMPLANT
GLOVE BIOGEL PI INDICATOR 7.5 (GLOVE) ×2
GOWN STRL REUS W/ TWL LRG LVL3 (GOWN DISPOSABLE) ×2 IMPLANT
GOWN STRL REUS W/ TWL XL LVL3 (GOWN DISPOSABLE) ×2 IMPLANT
GOWN STRL REUS W/TWL LRG LVL3 (GOWN DISPOSABLE) ×4
GOWN STRL REUS W/TWL XL LVL3 (GOWN DISPOSABLE) ×4
GUIDEWIRE STR DUAL SENSOR (WIRE) ×2 IMPLANT
KIT TURNOVER KIT A (KITS) ×4 IMPLANT
LABEL OR SOLS (LABEL) ×4 IMPLANT
NDL HYPO 25X1 1.5 SAFETY (NEEDLE) ×2 IMPLANT
NEEDLE HYPO 25X1 1.5 SAFETY (NEEDLE) ×4 IMPLANT
NS IRRIG 500ML POUR BTL (IV SOLUTION) ×4 IMPLANT
PACK BASIN MINOR (MISCELLANEOUS) ×4 IMPLANT
PACK CYSTO AR (MISCELLANEOUS) ×2 IMPLANT
SET CYSTO W/LG BORE CLAMP LF (SET/KITS/TRAYS/PACK) ×4 IMPLANT
SOL .9 NS 3000ML IRR  AL (IV SOLUTION) ×4
SOL .9 NS 3000ML IRR AL (IV SOLUTION) ×2
SOL .9 NS 3000ML IRR UROMATIC (IV SOLUTION) ×2 IMPLANT
SURGILUBE 2OZ TUBE FLIPTOP (MISCELLANEOUS) ×4 IMPLANT
SUT CHROMIC 3 0 PS 2 (SUTURE) ×4 IMPLANT
SUT ETHILON 3-0 FS-10 30 BLK (SUTURE)
SUT VIC AB 2-0 SH 27 (SUTURE) ×8
SUT VIC AB 2-0 SH 27XBRD (SUTURE) ×4 IMPLANT
SUTURE EHLN 3-0 FS-10 30 BLK (SUTURE) IMPLANT
SYR 10ML LL (SYRINGE) ×4 IMPLANT
WATER STERILE IRR 1000ML POUR (IV SOLUTION) ×4 IMPLANT

## 2019-09-09 NOTE — Op Note (Signed)
Date of procedure: 09/09/19  Preoperative diagnosis:  1. Gross hematuria 2.   Left hydrocele  Postoperative diagnosis:  1. Same  Procedure: 1. Cystoscopy 2. Left hydrocelectomy  Surgeon: Nickolas Madrid, MD  Anesthesia: General  Complications: None  Intraoperative findings:  1.  Normal cystoscopy with no suspicious bladder lesions, regrowth of friable prostatic tissue likely etiology of recurrent gross hematuria 2.  Uncomplicated left hydrocelectomy  EBL: Minimal  Specimens: None  Drains: None  Indication: Tyler Gordon is a 79 y.o. patient with gross hematuria and a left hydrocele.  He underwent a CT urogram yesterday that was benign.  He opted for a left hydrocelectomy with simultaneous cystoscopy to complete hematuria work-up.  After reviewing the management options for treatment, they elected to proceed with the above surgical procedure(s). We have discussed the potential benefits and risks of the procedure, side effects of the proposed treatment, the likelihood of the patient achieving the goals of the procedure, and any potential problems that might occur during the procedure or recuperation. Informed consent has been obtained.  Description of procedure:  The patient was taken to the operating room and general anesthesia was induced. SCDs were placed for DVT prophylaxis. The patient was placed in the supine position, prepped and draped in the usual sterile fashion, and preoperative antibiotics(Ancef) were administered. A preoperative time-out was performed.   A flexible cystoscope was used to intubate the urethra and a normal-appearing urethra was followed proximally into the bladder.  The prostate was large with regrowth of friable prostatic tissue and a high bladder neck.  There were moderate bladder trabeculations, and the ureteral orifices were orthotopic bilaterally.  There were no suspicious bladder lesions.  The bladder was drained, and the scope removed.  An incision  was made in the left hemiscrotum and cautery used to dissect through dartos.  I bluntly dissected around the hydrocele sac and the testicle was delivered into the field.  A small incision was made in the tunica vaginalis, and 50 cc of clear yellow fluid was drained.  A Jaboulee repair was performed with a 2-0 vicryl. Meticulous hemostasis was achieved, and the wound was copiously irrigated.  The testicle was returned to the left hemiscrotum in normal anatomic orientation.  A 2-0 Vicryl was used to close the dartos layer, and a running 3-0 chromic for the skin.  10 cc of local anesthetic was used.  Bacitracin and Telfa with gauze fluffs and a jockstrap were applied for dressing.  Dispositi on: Stable to PACU  Plan: Follow-up in clinic in 6 weeks for wound check  Nickolas Madrid, MD

## 2019-09-09 NOTE — Interval H&P Note (Signed)
UROLOGY H&P UPDATE  Agree with prior H&P dated 08/24/2019.  78 year old male with bothersome left hydrocele who opted for hydrocelectomy, as well as gross hematuria and need cystoscopy to complete work-up.  CT urogram performed yesterday with no significant urologic abnormalities except for an enlarged prostate.  Cardiac: RRR Lungs: CTA bilaterally  Laterality: Left Procedure: Cystoscopy and left hydrocelectomy  Urine: 500 colonies staph contaminant  Informed consent obtained, we specifically discussed the risks of bleeding, infection, post-operative pain, need for additional procedures, bruising/swelling, possible additional findings on cystoscopy requiring additional procedures, and recurrence.  Billey Co, MD 09/09/2019

## 2019-09-09 NOTE — Anesthesia Postprocedure Evaluation (Signed)
Anesthesia Post Note  Patient: Tyler Gordon  Procedure(s) Performed: CYSTOSCOPY (N/A Bladder) HYDROCELECTOMY ADULT (Left Scrotum)  Patient location during evaluation: PACU Anesthesia Type: General Level of consciousness: awake and alert Pain management: pain level controlled Vital Signs Assessment: post-procedure vital signs reviewed and stable Respiratory status: spontaneous breathing, nonlabored ventilation, respiratory function stable and patient connected to nasal cannula oxygen Cardiovascular status: blood pressure returned to baseline and stable Postop Assessment: no apparent nausea or vomiting Anesthetic complications: no   No complications documented.   Last Vitals:  Vitals:   09/09/19 1655 09/09/19 1700  BP:  133/75  Pulse: 97 97  Resp: 19 19  Temp:    SpO2: (P) 92% 90%    Last Pain:  Vitals:   09/09/19 1630  TempSrc:   PainSc: 1                  Tyler Gordon

## 2019-09-09 NOTE — Anesthesia Preprocedure Evaluation (Signed)
Anesthesia Evaluation  Patient identified by MRN, date of birth, ID band Patient awake    Reviewed: Allergy & Precautions, NPO status , Patient's Chart, lab work & pertinent test results, reviewed documented beta blocker date and time   Airway Mallampati: III  TM Distance: >3 FB     Dental  (+) Chipped, Upper Dentures, Missing   Pulmonary pneumonia, resolved, former smoker,           Cardiovascular negative cardio ROS       Neuro/Psych negative neurological ROS  negative psych ROS   GI/Hepatic GERD  Controlled,  Endo/Other  Hypothyroidism   Renal/GU      Musculoskeletal  (+) Arthritis ,   Abdominal   Peds negative pediatric ROS (+)  Hematology   Anesthesia Other Findings Obese. Gout.  Reproductive/Obstetrics                             Anesthesia Physical  Anesthesia Plan  ASA: III  Anesthesia Plan: General   Post-op Pain Management:    Induction: Intravenous  PONV Risk Score and Plan:   Airway Management Planned: Oral ETT  Additional Equipment:   Intra-op Plan:   Post-operative Plan:   Informed Consent: I have reviewed the patients History and Physical, chart, labs and discussed the procedure including the risks, benefits and alternatives for the proposed anesthesia with the patient or authorized representative who has indicated his/her understanding and acceptance.       Plan Discussed with: CRNA  Anesthesia Plan Comments:         Anesthesia Quick Evaluation

## 2019-09-09 NOTE — Transfer of Care (Signed)
Immediate Anesthesia Transfer of Care Note  Patient: Tyler Gordon  Procedure(s) Performed: CYSTOSCOPY (N/A Bladder) HYDROCELECTOMY ADULT (Left Scrotum)  Patient Location: PACU  Anesthesia Type:General  Level of Consciousness: awake and sedated  Airway & Oxygen Therapy: Patient Spontanous Breathing and Patient connected to face mask oxygen  Post-op Assessment: Report given to RN and Post -op Vital signs reviewed and stable  Post vital signs: Reviewed and stable  Last Vitals:  Vitals Value Taken Time  BP 160/86 09/09/19 1518  Temp    Pulse 89 09/09/19 1523  Resp 16 09/09/19 1523  SpO2 97 % 09/09/19 1523  Vitals shown include unvalidated device data.  Last Pain:  Vitals:   09/09/19 1518  TempSrc:   PainSc: (P) 0-No pain         Complications: No complications documented.

## 2019-09-09 NOTE — Discharge Instructions (Signed)
AMBULATORY SURGERY  °DISCHARGE INSTRUCTIONS ° ° °1) The drugs that you were given will stay in your system until tomorrow so for the next 24 hours you should not: ° °A) Drive an automobile °B) Make any legal decisions °C) Drink any alcoholic beverage ° ° °2) You may resume regular meals tomorrow.  Today it is better to start with liquids and gradually work up to solid foods. ° °You may eat anything you prefer, but it is better to start with liquids, then soup and crackers, and gradually work up to solid foods. ° ° °3) Please notify your doctor immediately if you have any unusual bleeding, trouble breathing, redness and pain at the surgery site, drainage, fever, or pain not relieved by medication. ° ° ° °4) Additional Instructions: ° ° ° ° ° ° ° °Please contact your physician with any problems or Same Day Surgery at [phone], Monday through Friday 6 am to 4 pm, or [facility] [place] Main number at [phone]. °

## 2019-09-09 NOTE — Anesthesia Procedure Notes (Signed)
Procedure Name: Intubation Date/Time: 09/09/2019 2:17 PM Performed by: Nelda Marseille, CRNA Pre-anesthesia Checklist: Patient identified, Patient being monitored, Timeout performed, Emergency Drugs available and Suction available Patient Re-evaluated:Patient Re-evaluated prior to induction Oxygen Delivery Method: Circle system utilized Preoxygenation: Pre-oxygenation with 100% oxygen Induction Type: IV induction Ventilation: Mask ventilation without difficulty Laryngoscope Size: Mac, 3 and McGraph Grade View: Grade I Tube type: Oral Tube size: 7.5 mm Number of attempts: 1 Airway Equipment and Method: Stylet Placement Confirmation: ETT inserted through vocal cords under direct vision,  positive ETCO2 and breath sounds checked- equal and bilateral Secured at: 22 cm Tube secured with: Tape Dental Injury: Teeth and Oropharynx as per pre-operative assessment

## 2019-09-10 ENCOUNTER — Encounter: Payer: Self-pay | Admitting: Urology

## 2019-10-12 ENCOUNTER — Ambulatory Visit: Payer: Medicare Other | Admitting: Urology

## 2019-10-27 ENCOUNTER — Ambulatory Visit: Payer: Medicare Other | Admitting: Urology

## 2019-11-02 ENCOUNTER — Other Ambulatory Visit: Payer: Self-pay

## 2019-11-02 ENCOUNTER — Ambulatory Visit: Payer: Medicare Other | Admitting: Urology

## 2019-11-02 ENCOUNTER — Ambulatory Visit (INDEPENDENT_AMBULATORY_CARE_PROVIDER_SITE_OTHER): Payer: Medicare Other | Admitting: Urology

## 2019-11-02 VITALS — BP 148/91 | HR 92 | Ht 65.0 in | Wt 214.0 lb

## 2019-11-02 DIAGNOSIS — N138 Other obstructive and reflux uropathy: Secondary | ICD-10-CM

## 2019-11-02 DIAGNOSIS — N401 Enlarged prostate with lower urinary tract symptoms: Secondary | ICD-10-CM

## 2019-11-02 DIAGNOSIS — N432 Other hydrocele: Secondary | ICD-10-CM

## 2019-11-02 NOTE — Progress Notes (Signed)
   11/02/2019 2:58 PM   Tyler Gordon 09-Mar-1940 [id number]  Reason for visit: Follow up BPH, gross hematuria, recent hydrocelectomy  HPI: Tyler Gordon is an 79 year old male with a history of BPH status post TURP by Dr. Jeffie Pollock in the past, as well as recurrent episodes of gross hematuria as well as a bothersome left hydrocele.  He underwent a hematuria work-up with a CT urogram on 09/08/2019 that was essentially benign aside from an enlarged prostate.  He then underwent cystoscopy with me as well as left hydrocelectomy on 09/09/2019.  Cystoscopy at that time showed some regrowth of prostatic adenoma with some friable tissue, and his hydrocelectomy was uncomplicated.  Since surgery, has been doing well.  He still having some urinary urgency and frequency despite maximal medical therapy with Flomax and finasteride.  His primary urinary complaint is urgency.  He is minimally bothered by his urinary symptoms at this time.  He has still had some mild to moderate swelling of the left scrotum after surgery, but this is continued to improve.  On exam, he has a well-healed incision and there is no significant swelling of the testicle or recurrence of hydrocele, minimally tender.  We reviewed that hydrocelectomy can take 2 to 3 months for complete healing especially in the setting of his obesity.  I recommended snug fitting underwear.  I recommended he avoid bladder irritants, and we discussed return precautions.   RTC 6 months with IPSS, PVR, symptom check Continue Flomax and finasteride.  Consider trial of Myrbetriq at next visit if persistent urgency or incontinence, may need to consider HOLEP in the future if worsening bladder symptoms or incomplete emptying   Billey Co, MD  [facility] [address], [place], [place] [address] [phone]

## 2020-02-05 ENCOUNTER — Emergency Department
Admission: EM | Admit: 2020-02-05 | Discharge: 2020-02-05 | Disposition: A | Discharge status: Home / Self Care | Payer: Medicare HMO | Attending: Emergency Medicine | Admitting: Emergency Medicine

## 2020-02-05 ENCOUNTER — Encounter: Payer: Self-pay | Admitting: Emergency Medicine

## 2020-02-05 ENCOUNTER — Emergency Department: Payer: Medicare HMO

## 2020-02-05 ENCOUNTER — Other Ambulatory Visit: Payer: Self-pay

## 2020-02-05 DIAGNOSIS — Y92009 Unspecified place in unspecified non-institutional (private) residence as the place of occurrence of the external cause: Secondary | ICD-10-CM | POA: Insufficient documentation

## 2020-02-05 DIAGNOSIS — Z79899 Other long term (current) drug therapy: Secondary | ICD-10-CM | POA: Diagnosis not present

## 2020-02-05 DIAGNOSIS — Z87891 Personal history of nicotine dependence: Secondary | ICD-10-CM | POA: Diagnosis not present

## 2020-02-05 DIAGNOSIS — M1000 Idiopathic gout, unspecified site: Secondary | ICD-10-CM

## 2020-02-05 DIAGNOSIS — E039 Hypothyroidism, unspecified: Secondary | ICD-10-CM | POA: Diagnosis not present

## 2020-02-05 DIAGNOSIS — W540XXA Bitten by dog, initial encounter: Secondary | ICD-10-CM | POA: Diagnosis not present

## 2020-02-05 DIAGNOSIS — M10072 Idiopathic gout, left ankle and foot: Secondary | ICD-10-CM | POA: Diagnosis not present

## 2020-02-05 MED ORDER — COLCHICINE 0.6 MG PO TABS: 0.6000 mg | ORAL_TABLET | Freq: Every day | ORAL | 2 refills | Status: AC

## 2020-02-05 MED ORDER — AMOXICILLIN-POT CLAVULANATE 875-125 MG PO TABS: 1.0000 | ORAL_TABLET | Freq: Two times a day (BID) | ORAL | 0 refills | Status: AC

## 2020-02-05 MED ORDER — LIDOCAINE-EPINEPHRINE-TETRACAINE (LET) TOPICAL GEL
3.0000 mL | Freq: Once | TOPICAL | Status: AC
  Administered 2020-02-05: 3 mL via TOPICAL
  Filled 2020-02-05: qty 3

## 2020-02-05 MED ORDER — TRAMADOL HCL 50 MG PO TABS
50.0000 mg | ORAL_TABLET | Freq: Four times a day (QID) | ORAL | 0 refills | Status: DC | PRN
Start: 2020-02-05 — End: 2020-07-11

## 2020-02-05 MED ORDER — TETANUS-DIPHTH-ACELL PERTUSSIS 5-2.5-18.5 LF-MCG/0.5 IM SUSY: 0.5000 mL | PREFILLED_SYRINGE | Freq: Once | INTRAMUSCULAR | Status: DC

## 2020-02-05 MED ORDER — METHYLPREDNISOLONE 4 MG PO TBPK: ORAL_TABLET | ORAL | 0 refills | Status: DC

## 2020-02-05 NOTE — Discharge Instructions (Signed)
If the dog's shots are not up to date, animal control will quarantine it for 10 days, at that time if it shows signs of rabies you should return for rabies vaccines Take the medication as prescribed, continue your regular medications Clean the wound daily with soap and water Return if any sign of infection  See your regular doctor if the gout is not better in 3-4 days

## 2020-02-05 NOTE — ED Notes (Signed)
X-ray at bedside

## 2020-02-05 NOTE — ED Notes (Signed)
Non adherent dressing placed on right ankle

## 2020-02-05 NOTE — ED Provider Notes (Signed)
[facility] Emergency Department Provider Note  ____________________________________________   Event Date/Time   First MD Initiated Contact with Patient 02/05/20 1552     (approximate)  I have reviewed the triage vital signs and the nursing notes.   HISTORY  Chief Complaint Animal Bite    HPI Bennett K Heckmann is a 80 y.o. male presents emergency department after being bit by a dog at his home.  Patient states his neighbor has a large [place] that bit him when he went to the trash cans.  States that the dog's immunizations are up-to-date.  He states his tetanus was approximately 3 years ago.  Also complaining of gout in the left foot.    Past Medical History:  Diagnosis Date  . Arthritis 11-22-10   hx. gout(foot) , no problems in 7 yrs  . Benign prostatic hypertrophy 11-22-10   Foley catheter at present, surgery is planned  . GERD (gastroesophageal reflux disease) 11-22-10   reflux-tx. Baking Soda daily  . Gout   . Hypothyroidism   . Pneumonia 11-22-10   pneumonia x2, none in 76yrs.  . Psoriasis 11-22-10   occ. outbreaks    Patient Active Problem List   Diagnosis Date Noted  . Personal history of gout 08/13/2018  . Gross hematuria 12/31/2017  . Acquired hypothyroidism 05/27/2016  . Hypertriglyceridemia 05/27/2016  . History of prostate surgery 05/27/2016  . Seasonal allergic rhinitis due to pollen 05/27/2016  . BPH (benign prostatic hypertrophy) with urinary obstruction 11/26/2010  . History of gastroesophageal reflux (GERD) 11/26/2010  . COLONIC POLYPS 07/26/2008  . EXTERNAL HEMORRHOIDS 07/26/2008  . BLOOD IN STOOL 07/26/2008  . Hematochezia 07/26/2008    Past Surgical History:  Procedure Laterality Date  . CYSTOSCOPY N/A 09/09/2019   Procedure: CYSTOSCOPY;  Surgeon: Billey Co, MD;  Location: [facility] ORS;  Service: Urology;  Laterality: N/A;  . HYDROCELE EXCISION Left 09/09/2019   Procedure: HYDROCELECTOMY ADULT;  Surgeon:  Billey Co, MD;  Location: [facility] ORS;  Service: Urology;  Laterality: Left;  . INGUINAL HERNIA REPAIR Left 12/31/2017   Procedure: LAPAROSCOPIC INGUINAL HERNIA;  Surgeon: Herbert Pun, MD;  Location: [facility] ORS;  Service: General;  Laterality: Left;  . PROSTATE SURGERY    . VASECTOMY  11-22-10    Prior to Admission medications   Medication Sig Start Date End Date Taking? Authorizing Provider  amoxicillin-clavulanate (AUGMENTIN) 875-125 MG tablet Take 1 tablet by mouth 2 (two) times daily for 7 days. 02/05/20 02/12/20 Yes Greydon Betke, Linden Dolin, PA-C  colchicine 0.6 MG tablet Take 1 tablet (0.6 mg total) by mouth daily. 02/05/20 02/04/21 Yes Art Levan, Linden Dolin, PA-C  methylPREDNISolone (MEDROL DOSEPAK) 4 MG TBPK tablet Take 6 pills on day one then decrease by 1 pill each day 02/05/20  Yes Emannuel Vise, Linden Dolin, PA-C  traMADol (ULTRAM) 50 MG tablet Take 1 tablet (50 mg total) by mouth every 6 (six) hours as needed. 02/05/20  Yes Oaklyn Jakubek, Linden Dolin, PA-C  allopurinol (ZYLOPRIM) 300 MG tablet Take 300 mg by mouth every morning.     [provider]  clotrimazole-betamethasone (LOTRISONE) cream Apply 1 application topically 2 (two) times daily as needed (skin irritation.).    [provider]  esomeprazole (NEXIUM) 20 MG capsule Take 20 mg by mouth daily before breakfast.    [provider]  finasteride (PROSCAR) 5 MG tablet Take 1 tablet (5 mg total) by mouth every evening. 09/07/19   Billey Co, MD  levothyroxine (SYNTHROID, LEVOTHROID) 25 MCG tablet Take  25 mcg by mouth daily before breakfast.    [provider]  tamsulosin (FLOMAX) 0.4 MG CAPS capsule Take 1 capsule (0.4 mg total) by mouth every evening. 09/07/19   Billey Co, MD    Allergies Patient has no known allergies.  History reviewed. No pertinent family history.  Social History Social History   Tobacco Use  . Smoking status: Former Smoker    Packs/day: 0.50    Types: Cigarettes    Quit date:  11/21/1980    Years since quitting: 39.2  . Smokeless tobacco: Never Used  Vaping Use  . Vaping Use: Never used  Substance Use Topics  . Alcohol use: Yes    Alcohol/week: 2.0 standard drinks    Types: 2 Glasses of wine per week    Comment: occ  . Drug use: No    Review of Systems  Constitutional: No fever/chills Eyes: No visual changes. ENT: No sore throat. Respiratory: Denies cough Cardiovascular: Denies chest pain Gastrointestinal: Denies abdominal pain Genitourinary: Negative for dysuria. Musculoskeletal: Negative for back pain. Skin: Negative for rash.  Positive dog bite Psychiatric: no mood changes,     ____________________________________________   PHYSICAL EXAM:  VITAL SIGNS: ED Triage Vitals  Enc Vitals Group     BP 02/05/20 1412 (!) 165/82     Pulse Rate 02/05/20 1412 (!) 104     Resp 02/05/20 1412 20     Temp 02/05/20 1412 97.8 F (36.6 C)     Temp Source 02/05/20 1412 Oral     SpO2 02/05/20 1412 99 %     Weight 02/05/20 1338 210 lb (95.3 kg)     Height 02/05/20 1338 5\' 5"  (1.651 m)     Head Circumference --      Peak Flow --      Pain Score 02/05/20 1338 5     Pain Loc --      Pain Edu? --      Excl. in [place]? --     Constitutional: Alert and oriented. Well appearing and in no acute distress. Eyes: Conjunctivae are normal.  Head: Atraumatic. Nose: No congestion/rhinnorhea. Mouth/Throat: Mucous membranes are moist.  Neck:  supple no lymphadenopathy noted Cardiovascular: Normal rate, regular rhythm.  Respiratory: Normal respiratory effort.  No retractions,  GU: deferred Musculoskeletal: FROM all extremities, warm and well perfused, right elbow has swelling noted along the musculature, tenderness noted at bite marks that located on the medial and lateral aspects, no active bleeding at this time, right ankle has medial and lateral bite marks noted, neurovascular is intact, left ankle is red and swollen typical of gout Neurologic:  Normal speech and  language.  Skin:  Skin is warm, dry  No rash noted. Psychiatric: Mood and affect are normal. Speech and behavior are normal.  ____________________________________________   LABS (all labs ordered are listed, but only abnormal results are displayed)  Labs Reviewed - No data to display ____________________________________________   ____________________________________________  RADIOLOGY  X-ray of the right elbow, right ankle and left ankle  ____________________________________________   PROCEDURES  Procedure(s) performed: No  Procedures    ____________________________________________   INITIAL IMPRESSION / ASSESSMENT AND PLAN / ED COURSE  Pertinent labs & imaging results that were available during my care of the patient were reviewed by me and considered in my medical decision making (see chart for details).   Patient is 80 year old male presents with a dog bite.  See HPI.  Physical exam is consistent with multiple dog bites.  X-ray of  the right ankle, right elbow, and left ankle are negative for any acute bony abnormality, all have soft tissue swelling noted.  No foreign body noted.  L ET applied to the wound  Wounds were cleaned by me.  Dressings applied by nursing staff.  Patient was given a prescription for Augmentin, tramadol, Medrol Dosepak and colchicine for his gout.  He is to follow-up with his regular doctor if not improving in 3 to 4 days.  Follow-up with his regular doctor return emergency department for any sign of infection.  If the dog's shots are not up-to-date he should return the emergency department if it shows any signs of rabies within the next 10 days.  He states he understands.  Patient was discharged in stable condition.    Milford K Mignogna was evaluated in Emergency Department on 02/05/2020 for the symptoms described in the history of present illness. He was evaluated in the context of the global COVID-19 pandemic, which necessitated consideration  that the patient might be at risk for infection with the SARS-CoV-2 virus that causes COVID-19. Institutional protocols and algorithms that pertain to the evaluation of patients at risk for COVID-19 are in a state of rapid change based on information released by regulatory bodies including the CDC and federal and state organizations. These policies and algorithms were followed during the patient's care in the ED.    As part of my medical decision making, I reviewed the following data within the [facility] notes reviewed and incorporated, Old chart reviewed, Radiograph reviewed , Notes from prior ED visits and [place] Controlled Substance Database  ____________________________________________   FINAL CLINICAL IMPRESSION(S) / ED DIAGNOSES  Final diagnoses:  Dog bite, initial encounter  Idiopathic gout, unspecified chronicity, unspecified site      NEW MEDICATIONS STARTED DURING THIS VISIT:  New Prescriptions   AMOXICILLIN-CLAVULANATE (AUGMENTIN) 875-125 MG TABLET    Take 1 tablet by mouth 2 (two) times daily for 7 days.   COLCHICINE 0.6 MG TABLET    Take 1 tablet (0.6 mg total) by mouth daily.   METHYLPREDNISOLONE (MEDROL DOSEPAK) 4 MG TBPK TABLET    Take 6 pills on day one then decrease by 1 pill each day   TRAMADOL (ULTRAM) 50 MG TABLET    Take 1 tablet (50 mg total) by mouth every 6 (six) hours as needed.     Note:  This document was prepared using Dragon voice recognition software and may include unintentional dictation errors.    Versie Starks, PA-C 02/05/20 1807    Duffy Bruce, MD 02/05/20 2041

## 2020-02-05 NOTE — ED Triage Notes (Signed)
Pt to ED via POV with c/o dog bite to R ankle and R elbow that happened earlier today. Bleeding controlled at this time.

## 2020-02-05 NOTE — ED Notes (Signed)
Animal bite reported to [place] [employer].

## 2020-05-02 ENCOUNTER — Ambulatory Visit: Payer: Self-pay | Admitting: Urology

## 2020-05-04 ENCOUNTER — Encounter: Payer: Self-pay | Admitting: Urology

## 2020-05-28 ENCOUNTER — Encounter: Payer: Self-pay | Admitting: Urology

## 2020-05-28 ENCOUNTER — Other Ambulatory Visit: Payer: Self-pay

## 2020-05-28 ENCOUNTER — Ambulatory Visit: Payer: Medicare HMO | Admitting: Urology

## 2020-05-28 VITALS — BP 158/87 | HR 78 | Ht 65.0 in | Wt 214.0 lb

## 2020-05-28 DIAGNOSIS — N401 Enlarged prostate with lower urinary tract symptoms: Secondary | ICD-10-CM | POA: Diagnosis not present

## 2020-05-28 DIAGNOSIS — N432 Other hydrocele: Secondary | ICD-10-CM | POA: Diagnosis not present

## 2020-05-28 DIAGNOSIS — N138 Other obstructive and reflux uropathy: Secondary | ICD-10-CM

## 2020-05-28 DIAGNOSIS — R310 Gross hematuria: Secondary | ICD-10-CM

## 2020-05-28 DIAGNOSIS — R102 Pelvic and perineal pain: Secondary | ICD-10-CM | POA: Diagnosis not present

## 2020-05-28 LAB — BLADDER SCAN AMB NON-IMAGING

## 2020-05-28 MED ORDER — FINASTERIDE 5 MG PO TABS: 5.0000 mg | ORAL_TABLET | Freq: Every evening | ORAL | 3 refills | Status: AC

## 2020-05-28 MED ORDER — TAMSULOSIN HCL 0.4 MG PO CAPS: 0.4000 mg | ORAL_CAPSULE | Freq: Every evening | ORAL | 3 refills | Status: AC

## 2020-05-28 NOTE — Progress Notes (Signed)
   05/28/2020 5:16 PM   Cardin K Craine Feb 11, 1940 [id number]  Reason for visit: Follow up BPH/gross hematuria, left hydrocelectomy  HPI: Mr. Tomeo is an 80 year old male with a history of BPH status post TURP by Dr. Jeffie Pollock in the past, as well as recurrent episodes of gross hematuria and a bothersome left hydrocele.  He underwent a hematuria work-up with a CT urogram on 09/08/2019 that was essentially benign aside from an enlarged prostate.  He ultimately opted for a left hydrocelectomy in September 8546 that was uncomplicated, and cystoscopy at that time to complete hematuria work-up showed regrowth of prostatic adenoma with some friable tissue.  He remains on maximal medical therapy with Flomax and finasteride.  Overall his urinary symptoms are well controlled.  He has not had any recurrent gross hematuria.  His hydrocele has healed well and he denies any pain or swelling in the left testicle.  He reports 1 episode of lower pelvic pain after voiding within the last few days it resolved after 10 to 20 seconds.  He denies any persistent dysuria or urgency/frequency.  PVR is normal at 64 mL today.  Urinalysis today is pending.  I recommended continuing Flomax and finasteride for his BPH with recurrent gross hematuria, and we could consider HOLEP in the future if he had worsening of his urinary symptoms or recurrent gross hematuria.  We will call with urinalysis results regarding single episode of lower abdominal pain  Billey Co, MD  [facility] [address], [place], [place] [address] [phone]

## 2020-05-29 ENCOUNTER — Telehealth: Payer: Self-pay

## 2020-05-29 LAB — URINALYSIS, COMPLETE
Bilirubin, UA: NEGATIVE
Glucose, UA: NEGATIVE
Ketones, UA: NEGATIVE
Leukocytes,UA: NEGATIVE
Nitrite, UA: NEGATIVE
Protein,UA: NEGATIVE
RBC, UA: NEGATIVE
Specific Gravity, UA: 1.015 (ref 1.005–1.030)
Urobilinogen, Ur: 0.2 mg/dL (ref 0.2–1.0)
pH, UA: 5 (ref 5.0–7.5)

## 2020-05-29 LAB — MICROSCOPIC EXAMINATION: Bacteria, UA: NONE SEEN

## 2020-05-29 NOTE — Telephone Encounter (Signed)
Called pt, no answer. Unable to leave message as voicemail is full. 1st attempt. Appt scheduled.

## 2020-05-29 NOTE — Telephone Encounter (Signed)
-----   Message from Billey Co, MD sent at 05/29/2020  1:41 PM EDT ----- Urinalysis totally normal with no blood or infection.  Recommend yearly follow-up with PVR for his BPH

## 2020-05-31 LAB — CULTURE, URINE COMPREHENSIVE

## 2020-05-31 NOTE — Telephone Encounter (Signed)
Called pt, no answer. Unable to leave message as voicemail is full. 2nd attempt.

## 2020-05-31 NOTE — Telephone Encounter (Signed)
Pt returns call, informed him of the information below. Pt voiced understanding.

## 2020-07-11 ENCOUNTER — Ambulatory Visit: Payer: Medicare HMO | Admitting: Urology

## 2020-07-11 ENCOUNTER — Other Ambulatory Visit: Payer: Self-pay

## 2020-07-11 ENCOUNTER — Encounter: Payer: Self-pay | Admitting: Urology

## 2020-07-11 VITALS — BP 136/77 | HR 94 | Ht 65.0 in | Wt 210.0 lb

## 2020-07-11 DIAGNOSIS — N401 Enlarged prostate with lower urinary tract symptoms: Secondary | ICD-10-CM | POA: Diagnosis not present

## 2020-07-11 DIAGNOSIS — N529 Male erectile dysfunction, unspecified: Secondary | ICD-10-CM

## 2020-07-11 DIAGNOSIS — N138 Other obstructive and reflux uropathy: Secondary | ICD-10-CM

## 2020-07-11 MED ORDER — TADALAFIL 10 MG PO TABS: 10.0000 mg | ORAL_TABLET | Freq: Every day | ORAL | 11 refills | Status: DC | PRN

## 2020-07-11 NOTE — Progress Notes (Signed)
   07/11/2020 10:27 AM   Shomari K Leitch [birth year] [id number]  Reason for visit: ED  HPI: 80 year old male I have previously followed for BPH, history of gross hematuria likely secondary to BPH, and a left hydrocelectomy.  He is on maximal medical therapy with Flomax and finasteride.  He has healed well from his hydrocelectomy in September 2021.  He reports new erectile dysfunction and is interested in a trial of medications.  He recently met a new partner, and has not been sexually active in the last 5+ years.  It sounds like he tried sildenafil 20 mg daily with some mild improvement, but is interested in other options.  We reviewed the risks and benefits of the PDE 5 inhibitors at length, and I recommended a trial of Cialis 10 mg daily.  We discussed this could also be taken as needed, and that the max dose would be 20 mg.    Trial of Cialis 10 mg daily, okay to increase to 20 mg if needed RTC 4 weeks symptom check, consider testosterone at that visit if persistent ED   Billey Co, [facility] [address] [place], [place] [address] [phone]

## 2020-07-11 NOTE — Patient Instructions (Signed)
Tadalafil Tablets (Erectile Dysfunction, BPH) What is this medication? TADALAFIL (tah DA la fil) treats erectile dysfunction (ED). It works by increasing blood flow to the penis, which helps to maintain an erection. It may also be used to treat symptoms of an enlarged prostate (benign prostatichyperplasia). This medicine may be used for other purposes; ask your health care provider orpharmacist if you have questions. COMMON BRAND NAME(S): Kathaleen Bury, Cialis What should I tell my care team before I take this medication? They need to know if you have any of these conditions: Anatomical deformation of the penis, Peyronie's disease, or history of priapism (painful and prolonged erection) Bleeding disorders Eye or vision problems, including a rare inherited eye disease called retinitis pigmentosa Heart disease, angina, a history of heart attack, irregular heart beats, or other heart problems High or low blood pressure History of blood diseases, like sickle cell anemia or leukemia History of stomach bleeding Kidney disease Liver disease Stroke An unusual or allergic reaction to tadalafil, other medications, foods, dyes, or preservatives Pregnant or trying to get pregnant Breast-feeding How should I use this medication? Take this medication by mouth with a glass of water. Follow the directions on the prescription label. You may take this medication with or without meals. When this medication is used for erection problems, your care team may prescribe it to be taken once daily or as needed. If you are taking the medication as needed, you may be able to have sexual activity 30 minutes after taking it and for up to 36 hours after taking it. Whether you are taking the medication as needed or once daily, you should not take more than one dose per day. If you are taking this medication for symptoms of benign prostatic hyperplasia (BPH) or to treat both BPH and an erection problem, take the dose once daily  at about the same time each day. Do not take your medication moreoften than directed. Talk to your care team about the use of this medication in children. Specialcare may be needed. Overdosage: If you think you have taken too much of this medicine contact apoison control center or emergency room at once. NOTE: This medicine is only for you. Do not share this medicine with others. What if I miss a dose? If you are taking this medication as needed for erection problems, this does not apply. If you miss a dose while taking this medication once daily for an erection problem, benign prostatic hyperplasia, or both, take it as soon as youremember, but do not take more than one dose per day. What may interact with this medication? Do not take this medication with any of the following: Nitrates like amyl nitrite, isosorbide dinitrate, isosorbide mononitrate, nitroglycerin Other medications for erectile dysfunction like avanafil, sildenafil, vardenafil Other tadalafil products (Adcirca) Riociguat This medication may also interact with the following: Certain medications for high blood pressure Certain medications for the treatment of HIV infection or AIDS Certain medications used for fungal or yeast infections, like fluconazole, itraconazole, ketoconazole, and voriconazole Certain medications used for seizures like carbamazepine, phenytoin, and phenobarbital Grapefruit juice Macrolide antibiotics like clarithromycin, erythromycin, troleandomycin Medications for prostate problems Rifabutin, rifampin or rifapentine This list may not describe all possible interactions. Give your health care provider a list of all the medicines, herbs, non-prescription drugs, or dietary supplements you use. Also tell them if you smoke, drink alcohol, or use illegaldrugs. Some items may interact with your medicine. What should I watch for while using this medication? If you notice  any changes in your vision while taking this  medication, call your care team as soon as possible. Stop using this medication and call yourcare team right away if you have a loss of sight in one or both eyes. Contact your care team right away if the erection lasts longer than 4 hours or if it becomes painful. This may be a sign of serious problem and must betreated right away to prevent permanent damage. If you experience symptoms of nausea, dizziness, chest pain or arm pain upon initiation of sexual activity after taking this medication, you should refrainfrom further activity and call your care team as soon as possible. Do not drink alcohol to excess (examples, 5 glasses of wine or 5 shots of whiskey) when taking this medication. When taken in excess, alcohol can increase your chances of getting a headache or getting dizzy, increasing yourheart rate or lowering your blood pressure. Using this medication does not protect you or your partner against HIVinfection (the virus that causes AIDS) or other sexually transmitted diseases. What side effects may I notice from receiving this medication? Side effects that you should report to your care team as soon as possible: Allergic reactions-skin rash, itching, hives, swelling of the face, lips, tongue, or throat Hearing loss or ringing in ears Heart attack-pain or tightness in the chest, shoulders, arms, or jaw, nausea, shortness of breath, cold or clammy skin, feeling faint or lightheaded Low blood pressure-dizziness, feeling faint or lightheaded, blurry vision Prolonged or painful erection Redness, blistering, peeling, or loosening of the skin, including inside the mouth Stroke-sudden numbness or weakness of the face, arm, or leg, trouble speaking, confusion, trouble walking, loss of balance or coordination, dizziness, severe headache, change in vision Sudden vision loss in one or both eyes Side effects that usually do not require medical attention (report to your careteam if they continue or are  bothersome): Back pain Facial flushing or redness Headache Muscle pain Runny or stuffy nose Upset stomach This list may not describe all possible side effects. Call your doctor for medical advice about side effects. You may report side effects to FDA at1-800-FDA-1088. Where should I keep my medication? Keep out of the reach of children. Store at room temperature between 15 and 30 degrees C (59 and 86 degrees F).Throw away any unused medication after the expiration date. NOTE: This sheet is a summary. It may not cover all possible information. If you have questions about this medicine, talk to your doctor, pharmacist, orhealth care provider.  2022 Elsevier/Gold Standard (2020-02-13 14:49:19)

## 2020-07-26 ENCOUNTER — Ambulatory Visit: Payer: Self-pay | Admitting: Urology

## 2020-08-08 ENCOUNTER — Encounter: Payer: Self-pay | Admitting: Urology

## 2020-08-08 ENCOUNTER — Other Ambulatory Visit: Payer: Self-pay

## 2020-08-08 ENCOUNTER — Ambulatory Visit: Payer: Medicare HMO | Admitting: Urology

## 2020-08-08 VITALS — BP 151/82 | HR 81 | Ht 65.0 in | Wt 209.9 lb

## 2020-08-08 DIAGNOSIS — N401 Enlarged prostate with lower urinary tract symptoms: Secondary | ICD-10-CM | POA: Diagnosis not present

## 2020-08-08 DIAGNOSIS — N138 Other obstructive and reflux uropathy: Secondary | ICD-10-CM | POA: Diagnosis not present

## 2020-08-08 DIAGNOSIS — N529 Male erectile dysfunction, unspecified: Secondary | ICD-10-CM | POA: Diagnosis not present

## 2020-08-08 MED ORDER — TADALAFIL 20 MG PO TABS: 20.0000 mg | ORAL_TABLET | Freq: Every day | ORAL | 11 refills | Status: AC | PRN

## 2020-08-08 NOTE — Progress Notes (Signed)
   08/08/2020 9:39 AM   Tyler Gordon 1940/04/26 [id number]  Reason for visit: Follow up ED  HPI: 80 year old male with history of BPH, gross hematuria secondary to BPH, and left hydrocelectomy in September 2021.  At our last visit in July 2022 he had a new partner and was interested in resuming sexual activity and was having trouble with erections.  He opted for a trial of Cialis 10 mg daily.  He has had about a 50% improvement on this medication, but is not completely satisfied at this time.  I recommended increasing the dose to 20 mg daily.  We could also consider trying sildenafil in the future.  I also recommended checking a testosterone per the AUA guidelines.  Cialis increased to 20 mg daily Testosterone today, call with results He will contact us in the next few weeks to let us know how the increased dose and Cialis is working  [place], [place] [address] [place], [place] [address] [phone]

## 2020-08-09 ENCOUNTER — Telehealth: Payer: Self-pay

## 2020-08-09 LAB — TESTOSTERONE: Testosterone: 497 ng/dL (ref 264–916)

## 2020-08-09 NOTE — Telephone Encounter (Signed)
-----   Message from Billey Co, MD sent at 08/09/2020 10:49 AM EDT ----- Testosterone was well within the normal range, continue PDE 5 inhibitors as prescribed for ED  Nickolas Madrid, MD 08/09/2020

## 2020-08-09 NOTE — Telephone Encounter (Signed)
Mychart notification sent 

## 2020-09-19 ENCOUNTER — Other Ambulatory Visit (HOSPITAL_COMMUNITY): Payer: Self-pay | Admitting: Internal Medicine

## 2020-09-19 ENCOUNTER — Other Ambulatory Visit: Payer: Self-pay | Admitting: Internal Medicine

## 2020-09-19 DIAGNOSIS — M5416 Radiculopathy, lumbar region: Secondary | ICD-10-CM

## 2020-10-03 ENCOUNTER — Ambulatory Visit: Payer: Medicare HMO

## 2021-04-22 IMAGING — US US SCROTUM W/ DOPPLER COMPLETE
1 series · 13 of 25 positions shown · non-contrast
Comparison: None.

CLINICAL DATA: LEFT scrotal swelling following hernia surgery

EXAM:
SCROTAL ULTRASOUND
DOPPLER ULTRASOUND OF THE TESTICLES
TECHNIQUE: Complete ultrasound examination of the testicles, epididymis, and
other scrotal structures was performed. Color and spectral Doppler
ultrasound were also utilized to evaluate blood flow to the
testicles.

[Series 1: us scrotum w/ doppler complete · 0.07mm/px · 13 of 59 slices shown]
[im 1/59]
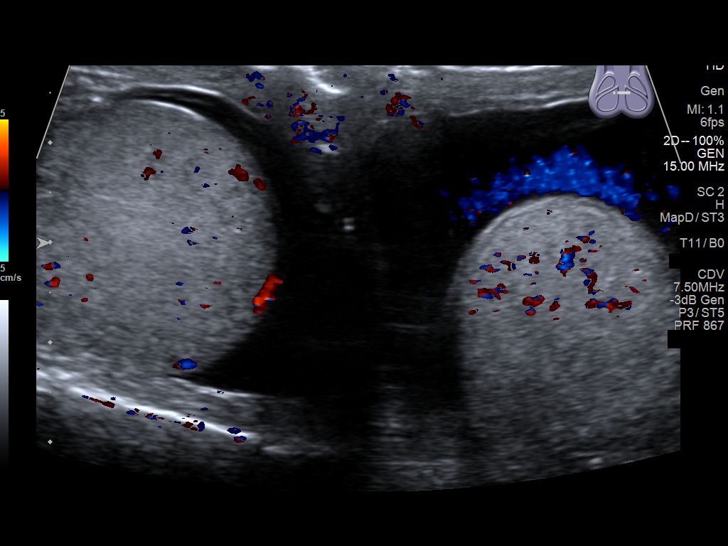
[im 5/59]
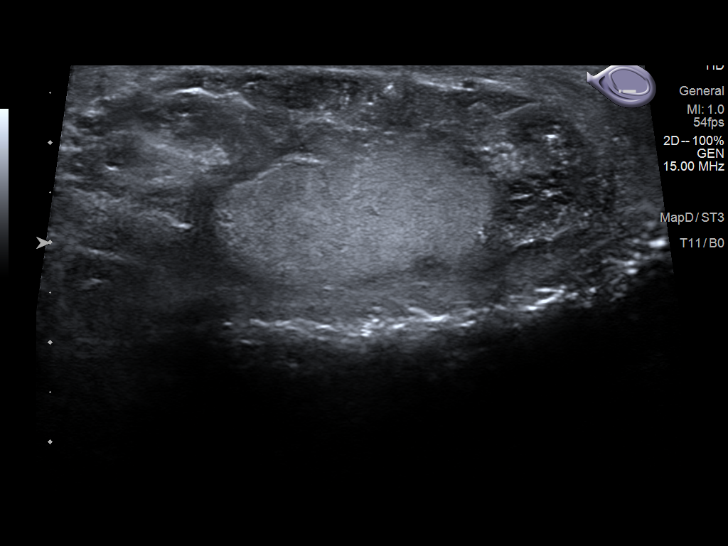
[im 10/59]
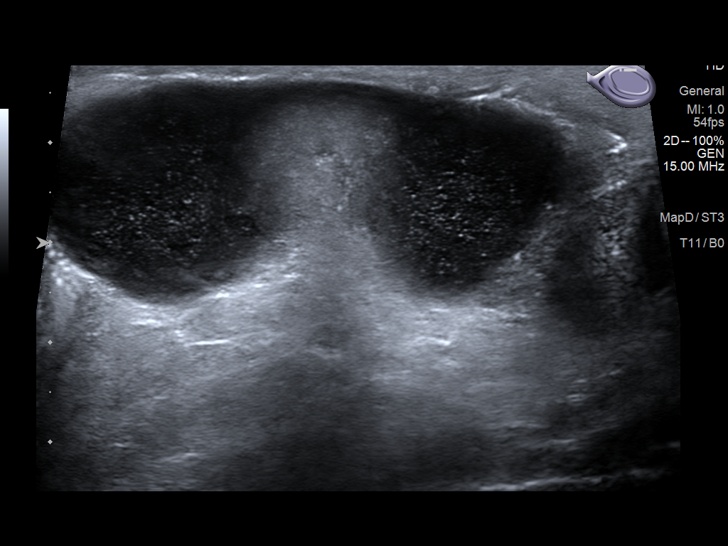
[im 15/59]
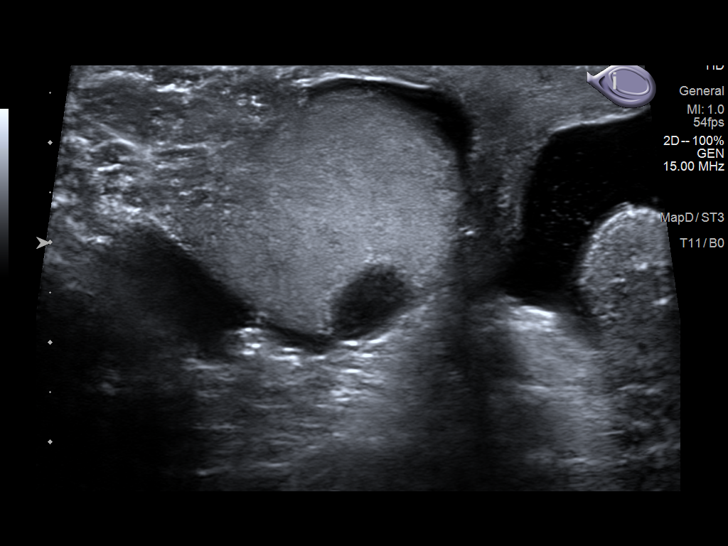
[im 20/59]
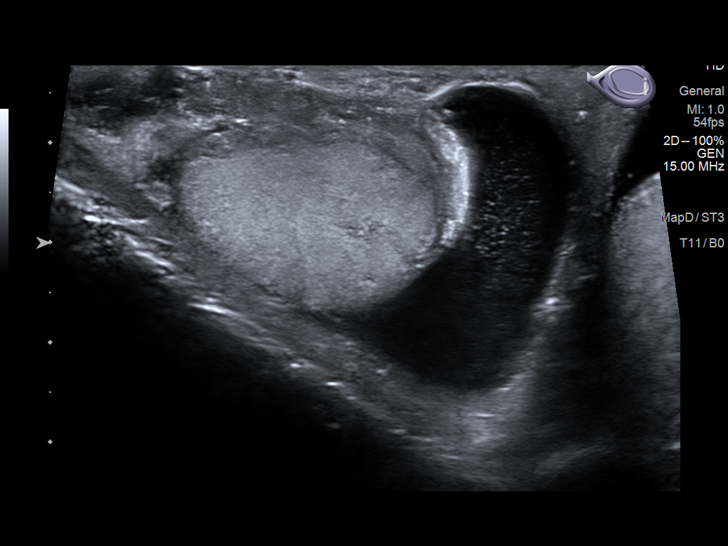
[im 25/59]
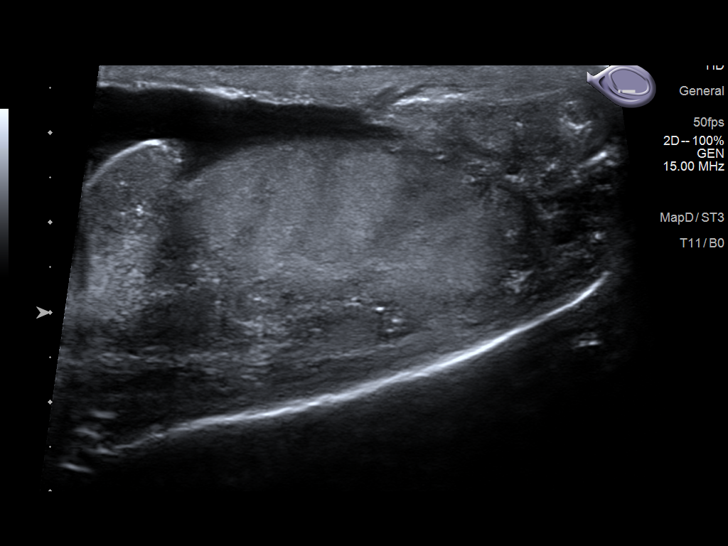
[im 30/59]
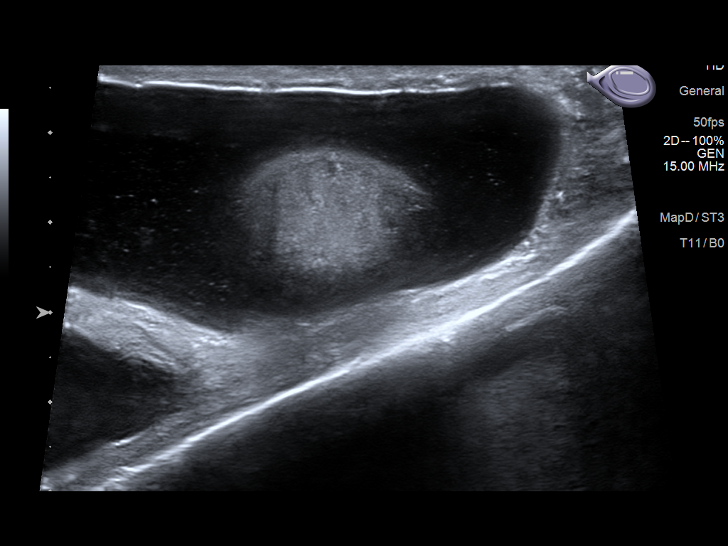
[im 34/59]
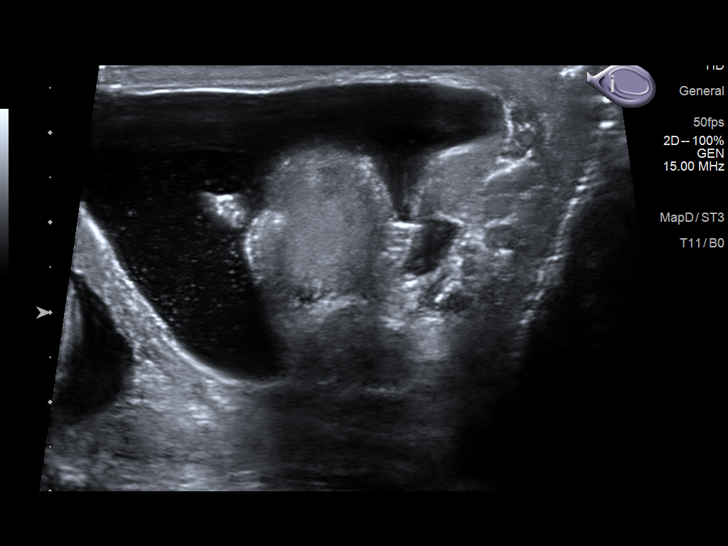
[im 39/59]
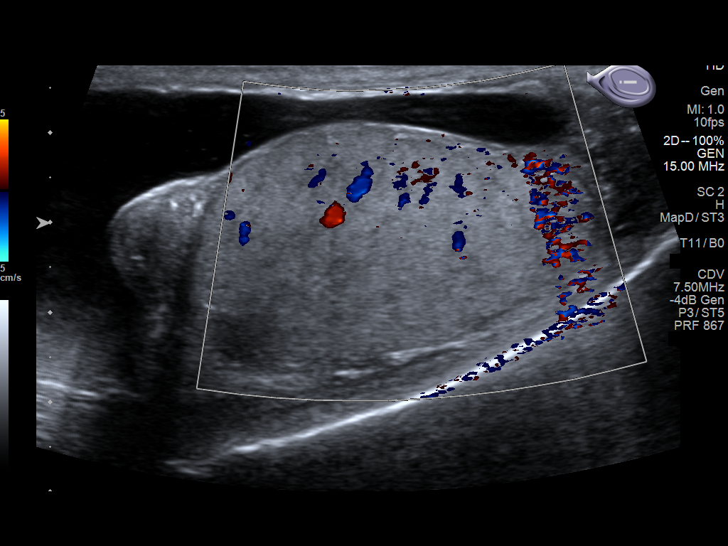
[im 44/59]
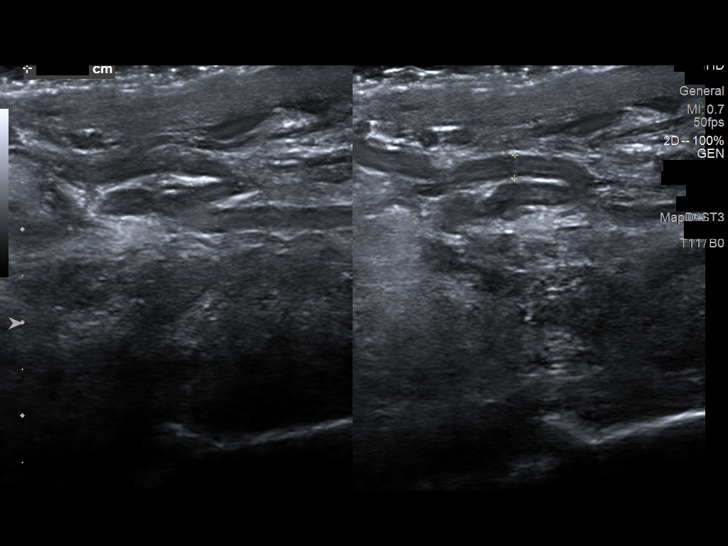
[im 49/59]
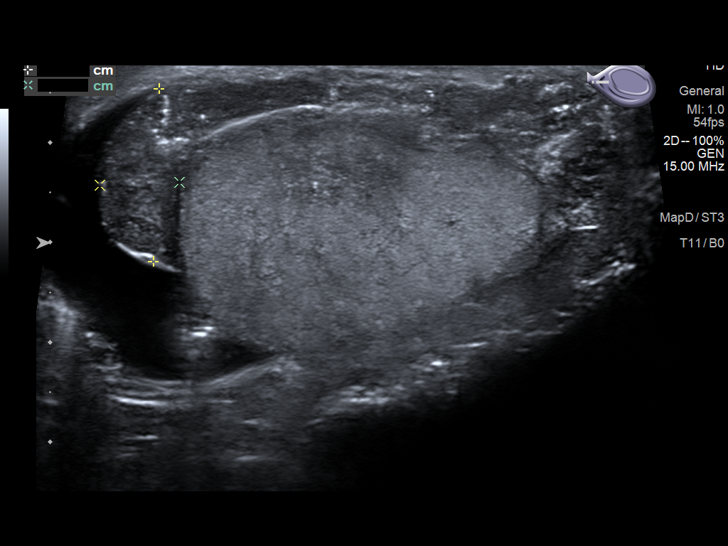
[im 54/59]
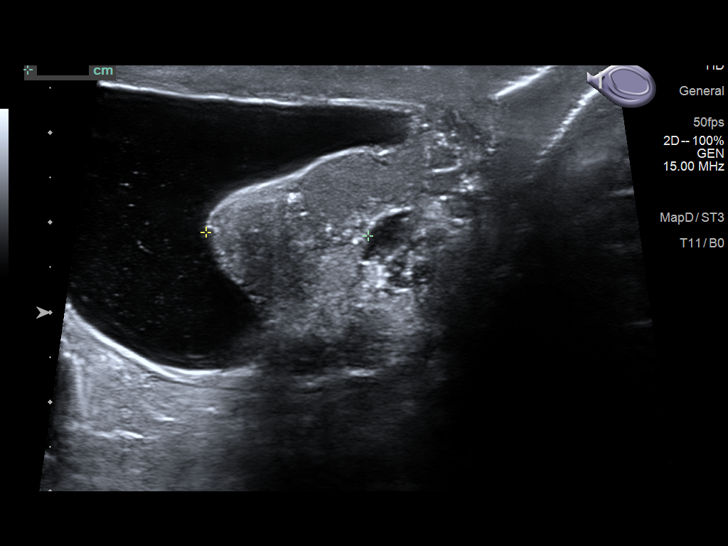
[im 59/59]
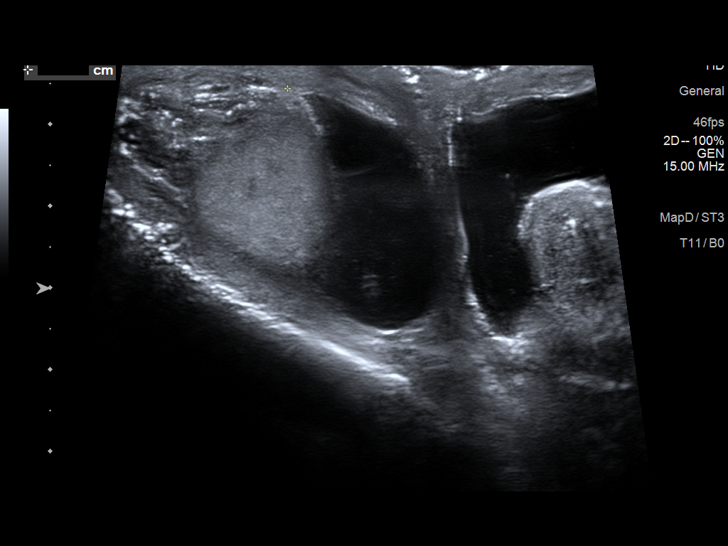

[13 of 25 positions shown; findings below may reference images not displayed]

FINDINGS: Right testicle

Measurements: 3.8 x 2.6 x 2.8 cm. Normal echogenicity. Small mildly
complicated cyst superomedially within RIGHT testis adjacent to the
tunica, 7 x 7 x 8 mm, taking a few scattered internal echoes. No
additional masses. Internal blood flow present on color Doppler
imaging.

Left testicle

Measurements: 4.7 x 2.8 x 3.0 cm. Normal echogenicity without mass
or calcification.

Right epididymis: Scattered calcifications in RIGHT epididymis. No
focal mass.

Left epididymis: Scattered calcifications within LEFT epididymis. No
focal mass.

Hydrocele: BILATERAL hydroceles LEFT greater than RIGHT. Scattered
debris within the hydroceles bilaterally.

Varicocele:  None visualized.

Pulsed Doppler interrogation of both testes demonstrates normal low
resistance arterial and venous waveforms bilaterally.
IMPRESSION: BILATERAL hydroceles LEFT greater than RIGHT containing scattered
debris bilaterally.

8 mm mildly complicated cyst within RIGHT testis, containing
scattered internal echoes; recommend follow-up ultrasound in 6
months to demonstrate stability.

No other scrotal sonographic abnormalities.

## 2021-05-13 ENCOUNTER — Encounter: Payer: Self-pay | Admitting: Urology

## 2021-05-30 ENCOUNTER — Ambulatory Visit: Payer: Self-pay | Admitting: Urology

## 2021-06-12 ENCOUNTER — Ambulatory Visit: Payer: Medicare HMO | Admitting: Urology

## 2021-06-14 ENCOUNTER — Encounter: Payer: Self-pay | Admitting: Urology

## 2021-10-10 IMAGING — DX DG ANKLE COMPLETE 3+V*L*
3 series · 3 of 3 positions shown · non-contrast
Comparison: None.

CLINICAL DATA: Worsening pain and swelling.  History of gout.

EXAM:
LEFT ANKLE COMPLETE - 3+ VIEW

[ankle ap]
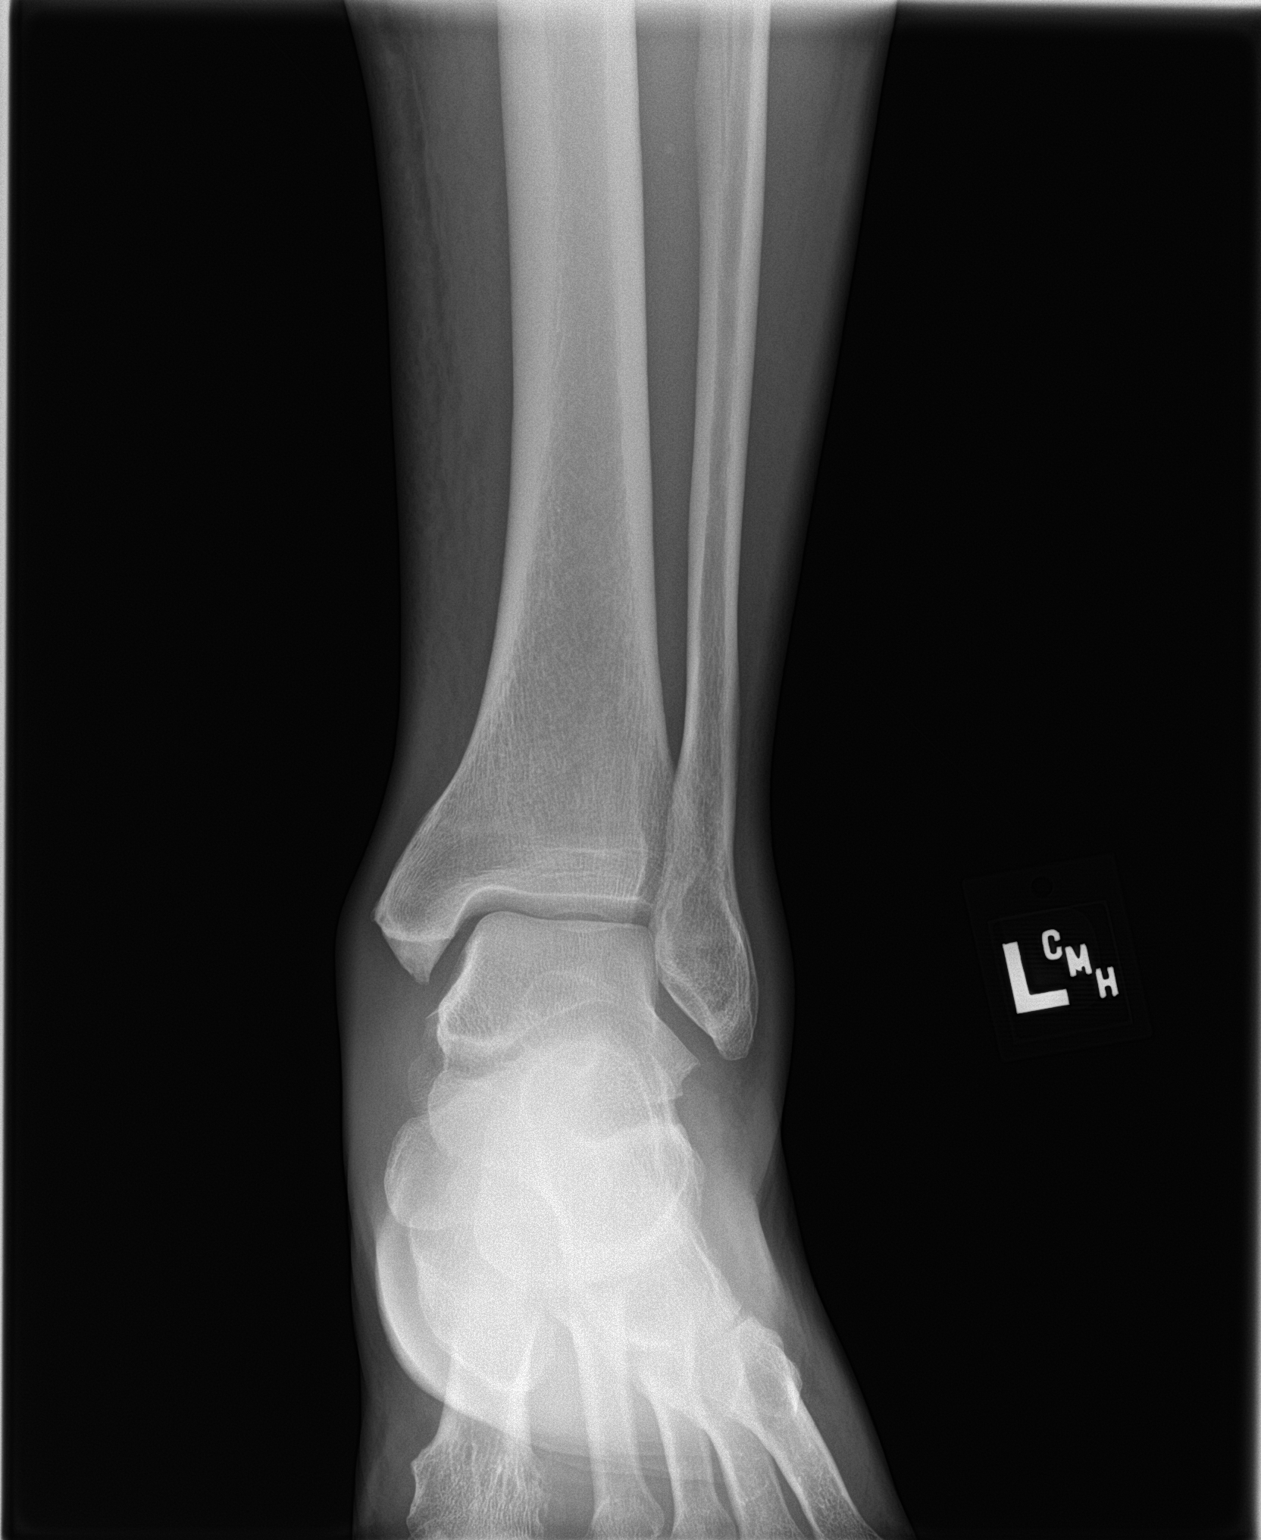

[ankle obl]
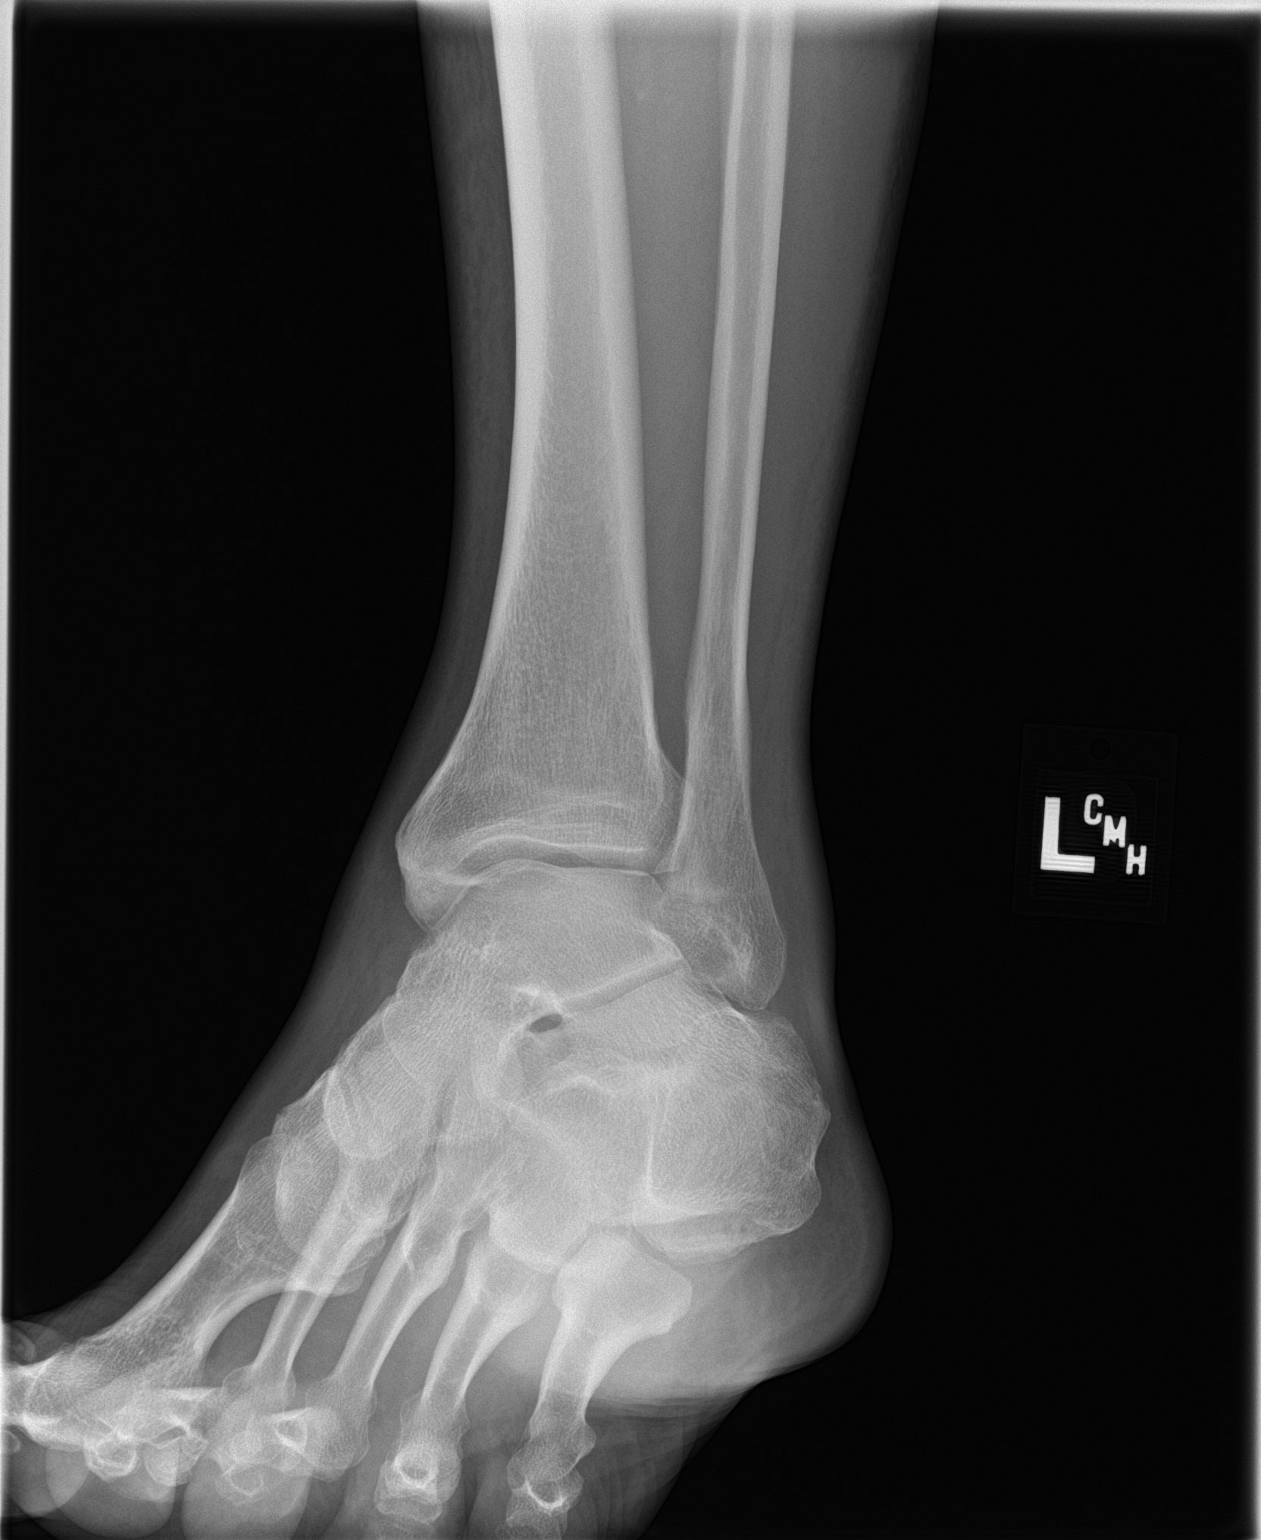

[ankle lat]
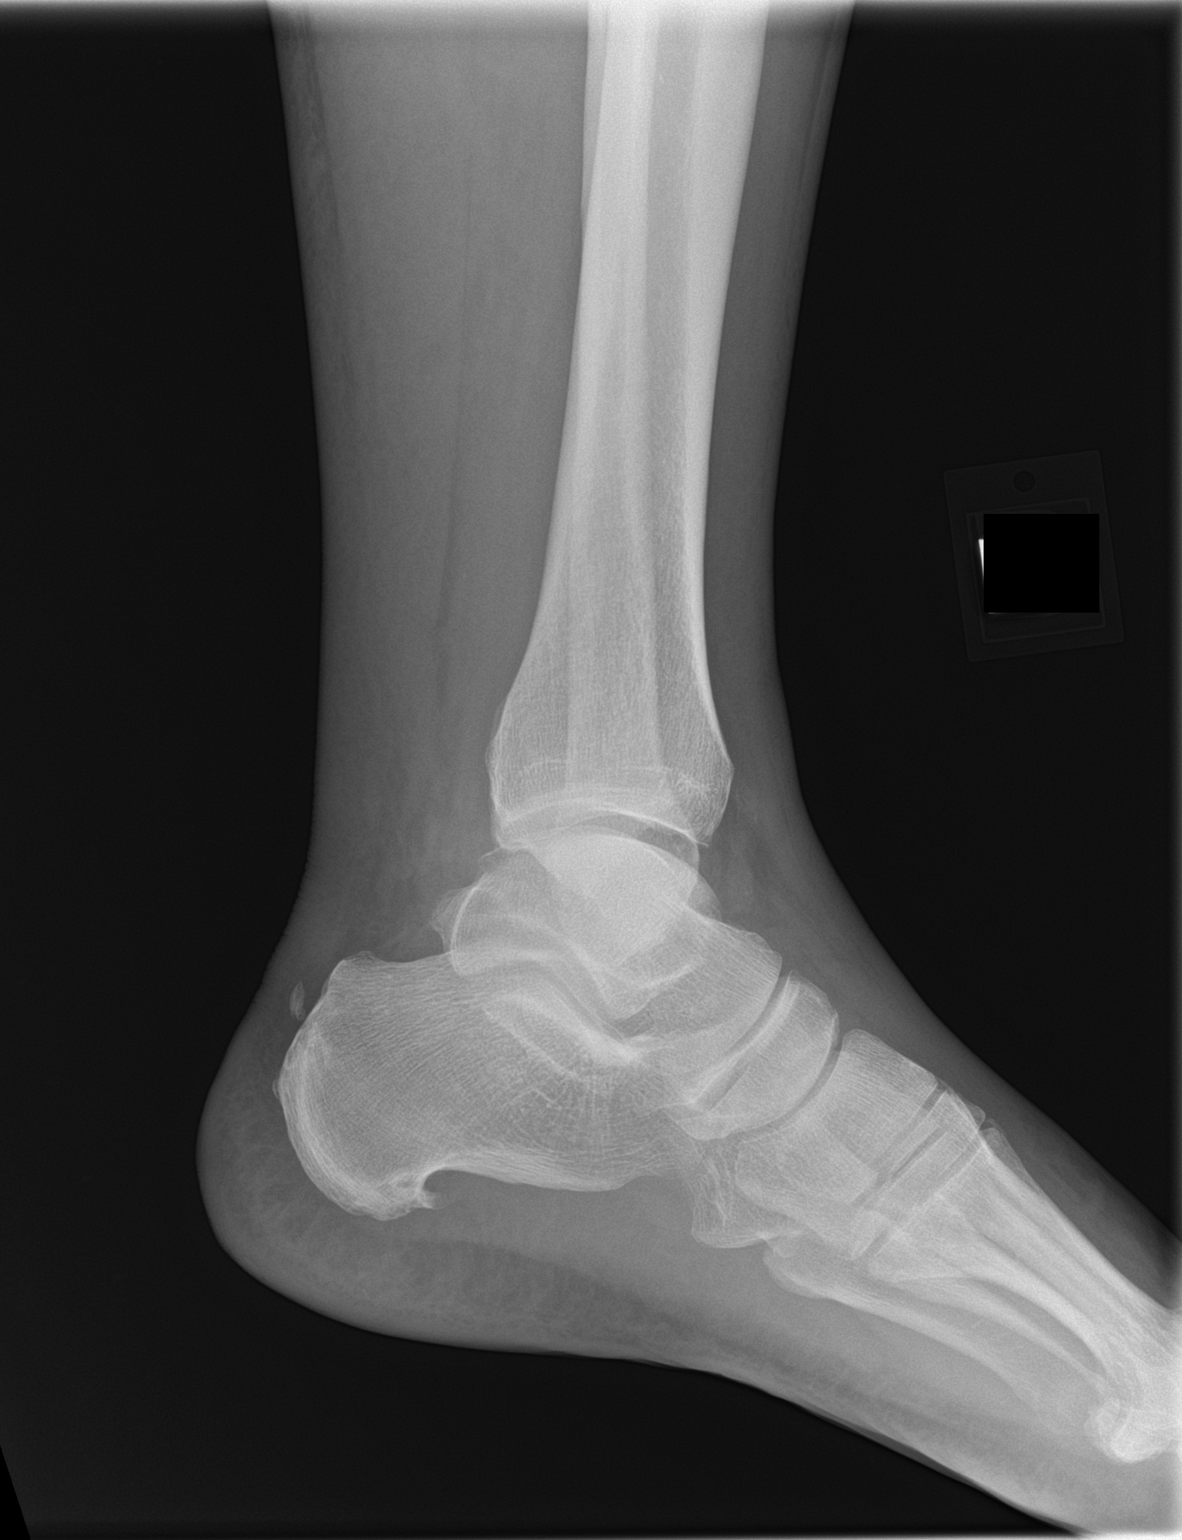

[3 of 3 positions shown; findings below may reference images not displayed]

FINDINGS: Normal alignment and joint spaces. Ankle mortise is preserved. No
fracture. Trace tibial talar degenerative spurring. No erosion or
bony destruction. Small plantar calcaneal spur and Achilles tendon
enthesophyte. Small ankle joint effusion. There is mild generalized
soft tissue edema.
IMPRESSION: Mild generalized soft tissue edema and small ankle joint effusion.
No acute osseous abnormality.
